# Patient Record
Sex: Male | Born: 1985 | Hispanic: No | Marital: Married | Smoking: Former smoker
Health system: Southern US, Community
[De-identification: ages and names within clinical notes are randomized; demographics above are authoritative.]

## PROBLEM LIST (undated history)

## (undated) DIAGNOSIS — R569 Unspecified convulsions: Secondary | ICD-10-CM

## (undated) HISTORY — PX: FOOT SURGERY: SHX648

---

## 2016-11-27 ENCOUNTER — Inpatient Hospital Stay (HOSPITAL_BASED_OUTPATIENT_CLINIC_OR_DEPARTMENT_OTHER)
Admission: EM | Admit: 2016-11-27 | Discharge: 2016-11-30 | DRG: 093 | Disposition: A | Discharge status: Home / Self Care | Payer: Self-pay | Attending: Family Medicine | Admitting: Family Medicine

## 2016-11-27 ENCOUNTER — Emergency Department (HOSPITAL_BASED_OUTPATIENT_CLINIC_OR_DEPARTMENT_OTHER): Payer: Self-pay

## 2016-11-27 ENCOUNTER — Encounter (HOSPITAL_BASED_OUTPATIENT_CLINIC_OR_DEPARTMENT_OTHER): Payer: Self-pay | Admitting: *Deleted

## 2016-11-27 DIAGNOSIS — R110 Nausea: Secondary | ICD-10-CM | POA: Diagnosis present

## 2016-11-27 DIAGNOSIS — R030 Elevated blood-pressure reading, without diagnosis of hypertension: Secondary | ICD-10-CM | POA: Diagnosis present

## 2016-11-27 DIAGNOSIS — Z833 Family history of diabetes mellitus: Secondary | ICD-10-CM

## 2016-11-27 DIAGNOSIS — H5509 Other forms of nystagmus: Secondary | ICD-10-CM | POA: Diagnosis present

## 2016-11-27 DIAGNOSIS — R51 Headache: Secondary | ICD-10-CM | POA: Diagnosis present

## 2016-11-27 DIAGNOSIS — T420X5A Adverse effect of hydantoin derivatives, initial encounter: Secondary | ICD-10-CM | POA: Diagnosis present

## 2016-11-27 DIAGNOSIS — H532 Diplopia: Secondary | ICD-10-CM | POA: Diagnosis present

## 2016-11-27 DIAGNOSIS — R42 Dizziness and giddiness: Secondary | ICD-10-CM | POA: Diagnosis present

## 2016-11-27 DIAGNOSIS — R739 Hyperglycemia, unspecified: Secondary | ICD-10-CM | POA: Diagnosis present

## 2016-11-27 DIAGNOSIS — T420X1A Poisoning by hydantoin derivatives, accidental (unintentional), initial encounter: Secondary | ICD-10-CM | POA: Diagnosis present

## 2016-11-27 DIAGNOSIS — R260 Ataxic gait: Principal | ICD-10-CM | POA: Diagnosis present

## 2016-11-27 DIAGNOSIS — Z87891 Personal history of nicotine dependence: Secondary | ICD-10-CM

## 2016-11-27 DIAGNOSIS — G40909 Epilepsy, unspecified, not intractable, without status epilepticus: Secondary | ICD-10-CM

## 2016-11-27 HISTORY — DX: Unspecified convulsions: R56.9

## 2016-11-27 LAB — URINALYSIS, ROUTINE W REFLEX MICROSCOPIC
Bilirubin Urine: NEGATIVE
Glucose, UA: NEGATIVE mg/dL
Hgb urine dipstick: NEGATIVE
KETONES UR: NEGATIVE mg/dL
LEUKOCYTES UA: NEGATIVE
NITRITE: NEGATIVE
PROTEIN: NEGATIVE mg/dL
Specific Gravity, Urine: 1.021 (ref 1.005–1.030)
pH: 7.5 (ref 5.0–8.0)

## 2016-11-27 LAB — CBC WITH DIFFERENTIAL/PLATELET
BASOS ABS: 0 10*3/uL (ref 0.0–0.1)
BASOS PCT: 0 %
EOS ABS: 0 10*3/uL (ref 0.0–0.7)
EOS PCT: 0 %
HCT: 40.7 % (ref 39.0–52.0)
Hemoglobin: 14.1 g/dL (ref 13.0–17.0)
Lymphocytes Relative: 16 %
Lymphs Abs: 1.2 10*3/uL (ref 0.7–4.0)
MCH: 31.5 pg (ref 26.0–34.0)
MCHC: 34.6 g/dL (ref 30.0–36.0)
MCV: 90.8 fL (ref 78.0–100.0)
MONO ABS: 0.6 10*3/uL (ref 0.1–1.0)
Monocytes Relative: 8 %
NEUTROS ABS: 5.9 10*3/uL (ref 1.7–7.7)
Neutrophils Relative %: 76 %
PLATELETS: 231 10*3/uL (ref 150–400)
RBC: 4.48 MIL/uL (ref 4.22–5.81)
RDW: 12.9 % (ref 11.5–15.5)
WBC: 7.8 10*3/uL (ref 4.0–10.5)

## 2016-11-27 LAB — BASIC METABOLIC PANEL
ANION GAP: 8 (ref 5–15)
BUN: 13 mg/dL (ref 6–20)
CALCIUM: 9 mg/dL (ref 8.9–10.3)
CO2: 26 mmol/L (ref 22–32)
CREATININE: 0.75 mg/dL (ref 0.61–1.24)
Chloride: 102 mmol/L (ref 101–111)
GFR calc Af Amer: >60 mL/min (ref >60)
GLUCOSE: 108 mg/dL — AB (ref 65–99)
Potassium: 3.9 mmol/L (ref 3.5–5.1)
Sodium: 136 mmol/L (ref 135–145)

## 2016-11-27 LAB — GLUCOSE, CAPILLARY: Glucose-Capillary: 82 mg/dL (ref 65–99)

## 2016-11-27 LAB — PHENYTOIN LEVEL, TOTAL: Phenytoin Lvl: 55.1 ug/mL (ref 10.0–20.0)

## 2016-11-27 MED ORDER — HYDRALAZINE HCL 20 MG/ML IJ SOLN
10.0000 mg | Freq: Four times a day (QID) | INTRAMUSCULAR | Status: DC | PRN
  Filled 2016-11-27: qty 1

## 2016-11-27 MED ORDER — SODIUM CHLORIDE 0.9 % IV SOLN
INTRAVENOUS | Status: AC
  Administered 2016-11-27: via INTRAVENOUS

## 2016-11-27 MED ORDER — ACETAMINOPHEN 650 MG RE SUPP: 650.0000 mg | Freq: Four times a day (QID) | RECTAL | Status: DC | PRN

## 2016-11-27 MED ORDER — ENOXAPARIN SODIUM 40 MG/0.4ML ~~LOC~~ SOLN
40.0000 mg | SUBCUTANEOUS | Status: DC
  Administered 2016-11-28 – 2016-11-30 (×3): 40 mg via SUBCUTANEOUS
  Filled 2016-11-27 (×3): qty 0.4

## 2016-11-27 MED ORDER — LEVETIRACETAM 500 MG PO TABS
1000.0000 mg | ORAL_TABLET | Freq: Two times a day (BID) | ORAL | Status: DC
  Administered 2016-11-27 – 2016-11-30 (×6): 1000 mg via ORAL
  Filled 2016-11-27 (×6): qty 2

## 2016-11-27 MED ORDER — MECLIZINE HCL 25 MG PO TABS
25.0000 mg | ORAL_TABLET | Freq: Once | ORAL | Status: AC
  Administered 2016-11-27: 25 mg via ORAL
  Filled 2016-11-27: qty 1

## 2016-11-27 MED ORDER — ACETAMINOPHEN 325 MG PO TABS: 650.0000 mg | ORAL_TABLET | Freq: Four times a day (QID) | ORAL | Status: DC | PRN

## 2016-11-27 MED ORDER — SODIUM CHLORIDE 0.9% FLUSH
3.0000 mL | Freq: Two times a day (BID) | INTRAVENOUS | Status: DC
  Administered 2016-11-28 – 2016-11-30 (×5): 3 mL via INTRAVENOUS

## 2016-11-27 NOTE — ED Triage Notes (Addendum)
Pt c/o dizziness and double vision x 4 days , sent here from [facility] for eval, pt denies h/a

## 2016-11-27 NOTE — H&P (Addendum)
TRH H&P   Patient Demographics:    Mario Mcbride, is a 31 y.o. male  MRN: [id number]   DOB - [birth year]  Admit Date - 11/27/2016  Outpatient Primary MD for the patient is Patient, No Pcp Per  Referring MD/NP/PA: Rolan BuccoMelanie Belfi  Outpatient Specialists:  [place] in [place]   [facility]  Chief Complaint  Patient presents with  . Dizziness      HPI:    Mario GarterRushi Ewalt  is a 31 y.o. male, w seizure disorder who apparently has been taking and extra 100mg  po qday of dilantin for the past 30 out of 60 days.  He typically take 300mg  po bid of dilantin and 1000mg  po bid of keppra.  Pt notes dizziness and blurred vision and gait instability  for the past 3 days and therefore presented to urgent care and then sent to ED for evaluation.    In ED,  Pt was noted to have dilantin level of 55.1  CT brain negative for any acute process.  No seizure activity noted.  [facility] of dilantin toxicity.     Review of systems:    In addition to the HPI above, No Fever-chills, No Headache, No changes with hearing, No problems swallowing food or Liquids, No Chest pain, Cough or Shortness of Breath, No Abdominal pain, No Nausea or Vommitting, Bowel movements are regular, No Blood in stool or Urine, No dysuria, No new skin rashes or bruises, No new joints pains-aches,  No new weakness, tingling, numbness in any extremity, No recent weight gain or loss, No polyuria, polydypsia or polyphagia, No significant Mental Stressors.  A full 10 point Review of Systems was done, except as stated above, all other Review of Systems were negative.   With Past History of the following :    Past Medical History:  Diagnosis Date  . Seizures (HCC)       Past Surgical History:  Procedure Laterality Date  . FOOT SURGERY Left        Social History:     Social History  Substance Use Topics  . Smoking status: Former Smoker    Types: Cigarettes  . Smokeless tobacco: Never Used  . Alcohol use No     Lives - at home  Mobility - walks by self typically   Family History :     Family History  Problem Relation Age of Onset  . Diabetes Mother       Home Medications:   Prior to Admission medications   Medication Sig Start Date End Date Taking? Authorizing Provider  levETIRAcetam (KEPPRA) 1000 MG tablet Take 1,000 mg by mouth 2 (two) times daily.   Yes [provider]  phenytoin (DILANTIN) 300 MG ER capsule Take 300 mg by mouth 2 (two) times daily.   Yes [provider]  Allergies:    No Known Allergies   Physical Exam:   Vitals  Blood pressure (!) 113/93, pulse 60, temperature 98.4 F (36.9 C), resp. rate 18, height 5\' 11"  (1.803 m), weight 89.4 kg (197 lb), SpO2 100 %.   1. General lying in bed in NAD,    2. Normal affect and insight, Not Suicidal or Homicidal, Awake Alert, Oriented X 3.  3. No F.N deficits, ALL C.Nerves Intact, Strength 5/5 all 4 extremities, Sensation intact all 4 extremities, Plantars down going.  4. Ears and Eyes appear Normal, Conjunctivae clear, PERRLA. Moist Oral Mucosa.  5. Supple Neck, No JVD, No cervical lymphadenopathy appriciated, No Carotid Bruits.  6. Symmetrical Chest wall movement, Good air movement bilaterally, CTAB.  7. RRR, No Gallops, Rubs or Murmurs, No Parasternal Heave.  8. Positive Bowel Sounds, Abdomen Soft, No tenderness, No organomegaly appriciated,No rebound -guarding or rigidity.  9.  No Cyanosis, Normal Skin Turgor, No Skin Rash or Bruise.  10. Good muscle tone,  joints appear normal , no effusions, Normal ROM.  11. No Palpable Lymph Nodes in Neck or Axillae  Good finger to nose, visual field intact    Data Review:    CBC  Recent Labs Lab 11/27/16 1539  WBC 7.8  HGB 14.1  HCT 40.7  PLT 231  MCV 90.8    MCH 31.5  MCHC 34.6  RDW 12.9  LYMPHSABS 1.2  MONOABS 0.6  EOSABS 0.0  BASOSABS 0.0   ------------------------------------------------------------------------------------------------------------------  Chemistries   Recent Labs Lab 11/27/16 1539  NA 136  K 3.9  CL 102  CO2 26  GLUCOSE 108*  BUN 13  CREATININE 0.75  CALCIUM 9.0   ------------------------------------------------------------------------------------------------------------------ estimated creatinine clearance is 143.8 mL/min (by C-G formula based on SCr of 0.75 mg/dL). ------------------------------------------------------------------------------------------------------------------ No results for input(s): TSH, T4TOTAL, T3FREE, THYROIDAB in the last 72 hours.  Invalid input(s): FREET3  Coagulation profile No results for input(s): INR, PROTIME in the last 168 hours. ------------------------------------------------------------------------------------------------------------------- No results for input(s): DDIMER in the last 72 hours. -------------------------------------------------------------------------------------------------------------------  Cardiac Enzymes No results for input(s): CKMB, TROPONINI, MYOGLOBIN in the last 168 hours.  Invalid input(s): CK ------------------------------------------------------------------------------------------------------------------ No results found for: BNP   ---------------------------------------------------------------------------------------------------------------  Urinalysis    Component Value Date/Time   COLORURINE YELLOW 11/27/2016 1616   APPEARANCEUR CLOUDY (A) 11/27/2016 1616   LABSPEC 1.021 11/27/2016 1616   PHURINE 7.5 11/27/2016 1616   GLUCOSEU NEGATIVE 11/27/2016 1616   HGBUR NEGATIVE 11/27/2016 1616   BILIRUBINUR NEGATIVE 11/27/2016 1616   KETONESUR NEGATIVE 11/27/2016 1616   PROTEINUR NEGATIVE 11/27/2016 1616   NITRITE NEGATIVE 11/27/2016  1616   LEUKOCYTESUR NEGATIVE 11/27/2016 1616    ----------------------------------------------------------------------------------------------------------------   Imaging Results:    Ct Head Wo Contrast  Result Date: 11/27/2016 CLINICAL DATA:  Dizziness, double vision. EXAM: CT HEAD WITHOUT CONTRAST TECHNIQUE: Contiguous axial images were obtained from the base of the skull through the vertex without intravenous contrast. COMPARISON:  None. 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 No mass effect or midline shift is noted. Ventricular size is within normal limits. There is no evidence of mass lesion, hemorrhage or acute infarction. Vascular: No hyperdense vessel or unexpected calcification. Skull: Normal. Negative for fracture or focal lesion. Sinuses/Orbits: No acute finding. Other: None. IMPRESSION: No acute intracranial abnormality seen. Electronically Signed   By: Lupita Raider, M.D.   On: 11/27/2016 16:33      Assessment & Plan:    Active Problems:   Dilantin toxicity  Seizure disorder (HCC)    Dizziness Check MRI brain STOP dilantin Check dilantin level in the am Neurology consulted by ED, appreciate input  Seizure do Cont Keppra 1000mg  po bid  Hyperglycemia Check hga1c  Elevated bp without diagnosis of hypertension Monitor bp    DVT Prophylaxis  Lovenox - SCDs     AM Labs Ordered, also please review Full Orders  Family Communication: Admission, patients condition and plan of care including tests being ordered have been discussed with the patient  who indicate understanding and agree with the plan and Code Status.  Code Status FULL CODE  Likely DC to  home  Condition GUARDED    Consults called: neurology by ED  Admission status: inpatient  Time spent in minutes : 45 minutes   Pearson Grippe M.D on 11/27/2016 at 9:41 PM  Between 7am to 7pm - Pager - [phone] After 7pm go to [email or web address] - password [facility]  [facility] - Office  [phone]

## 2016-11-27 NOTE — ED Provider Notes (Signed)
[facility] Provider Note   CSN: [id number] Arrival date & time: 11/27/16  1450   By signing my name below, I, Cynda Acres, attest that this documentation has been prepared under the direction and in the presence of Rolan Bucco, MD. Electronically Signed: Cynda Acres, Scribe. 11/27/16. 3:32 PM.   History   Chief Complaint Chief Complaint  Patient presents with  . Dizziness   HPI Comments: Mario Mcbride is a 31 y.o. male with a history of seizure disorder (diagnosed 4 years ago due to stress), who presents to the Emergency Department complaining of sudden-onset, intermittent dizziness that began 4 days ago. Patient states he suddenly developed intermittent dizziness and double vision. Patient states he is unable to ambulate without assistance due to an unsteady gait. His symptoms are worse with head rotation. Patient denies any loss of consciousness or head injury. Patient reports being seen at urgent care earlier today, he was advised to come to the emergency department for evaluation. Patient does have a history of seizure disorder that occurs while sleeping, he has been compliant with his keppra and dilantin, last episode was three months ago. Patient reports an associated headache. No medications were taken prior to arrival. Patient states his symptoms improve with lemon juice. Patient denies any fever, chills, cough, chest pain, shortness of breath, speech difficulty, rash, or any additional symptoms. No speech deficits.  He says that he sees two of everything when he looks with both eyes, if he covers one eye, he only sees one figure.  The history is provided by the patient. No language interpreter was used.    Past Medical History:  Diagnosis Date  . Seizures (HCC)     There are no active problems to display for this patient.   History reviewed. No pertinent surgical history.     Home Medications    Prior to Admission medications   Medication Sig Start  Date End Date Taking? Authorizing Provider  levETIRAcetam (KEPPRA) 1000 MG tablet Take 1,000 mg by mouth 2 (two) times daily.   Yes [provider]  phenytoin (DILANTIN) 300 MG ER capsule Take 300 mg by mouth 2 (two) times daily.   Yes [provider]    Family History No family history on file.  Social History Social History  Substance Use Topics  . Smoking status: Former Games developer  . Smokeless tobacco: Not on file  . Alcohol use No     Allergies   Patient has no known allergies.   Review of Systems Review of Systems  Constitutional: Negative for chills, diaphoresis, fatigue and fever.  HENT: Negative for congestion, rhinorrhea and sneezing.   Eyes: Positive for visual disturbance.  Respiratory: Negative for cough, chest tightness and shortness of breath.   Cardiovascular: Negative for chest pain and leg swelling.  Gastrointestinal: Negative for abdominal pain, blood in stool, diarrhea, nausea and vomiting.  Genitourinary: Negative for difficulty urinating, flank pain, frequency and hematuria.  Musculoskeletal: Positive for gait problem. Negative for arthralgias and back pain.  Skin: Negative for rash.  Neurological: Positive for dizziness and headaches. Negative for syncope, speech difficulty, weakness and numbness.  All other systems reviewed and are negative.    Physical Exam Updated Vital Signs BP (!) 120/97 (BP Location: Left Arm)   Pulse 64   Temp 99.9 F (37.7 C)   Resp 19   Ht 5\' 11"  (1.803 m)   Wt 89.4 kg (197 lb)   SpO2 99%   BMI 27.48 kg/m   Physical Exam  Constitutional: He is oriented to person, place, and time. He appears well-developed and well-nourished.  HENT:  Head: Normocephalic and atraumatic.  Eyes: Pupils are equal, round, and reactive to light.  Positive horizontal and rotational nystagmus, but no vertical nystagmus.   Neck: Normal range of motion. Neck supple.  Cardiovascular: Normal rate, regular rhythm and normal heart  sounds.   Pulmonary/Chest: Effort normal and breath sounds normal. No respiratory distress. He has no wheezes. He has no rales. He exhibits no tenderness.  Abdominal: Soft. Bowel sounds are normal. There is no tenderness. There is no rebound and no guarding.  Musculoskeletal: Normal range of motion. He exhibits no edema.  Lymphadenopathy:    He has no cervical adenopathy.  Neurological: He is alert and oriented to person, place, and time. He displays normal reflexes. No cranial nerve deficit or sensory deficit. He exhibits normal muscle tone. Coordination normal.  Motor 5/5 all extremities. Sensation grossly intact in all extremities. Cranial nerves 2-12 are grossly intact. Slightly ataxic gait.   Skin: Skin is warm and dry. No rash noted.  Psychiatric: He has a normal mood and affect.     ED Treatments / Results  DIAGNOSTIC STUDIES: Oxygen Saturation is 99% on RA, normal by my interpretation.    COORDINATION OF CARE: 3:31 PM Discussed treatment plan with pt at bedside and pt agreed to plan, which includes a head CT.   Labs (all labs ordered are listed, but only abnormal results are displayed) Labs Reviewed  BASIC METABOLIC PANEL - Abnormal; Notable for the following:       Result Value   Glucose, Bld 108 (*)    All other components within normal limits  URINALYSIS, ROUTINE W REFLEX MICROSCOPIC - Abnormal; Notable for the following:    APPearance CLOUDY (*)    All other components within normal limits  PHENYTOIN LEVEL, TOTAL - Abnormal; Notable for the following:    Phenytoin Lvl 55.1 (*)    All other components within normal limits  CBC WITH DIFFERENTIAL/PLATELET    EKG  EKG Interpretation  Date/Time:  Monday November 27 2016 15:55:42 EDT Ventricular Rate:  78 PR Interval:    QRS Duration: 84 QT Interval:  353 QTC Calculation: 402 R Axis:   62 Text Interpretation:  Sinus rhythm ST elev, probable normal early repol pattern No old tracing to compare Confirmed by Rolan BuccoBelfi,  Randye Treichler [phone]([id number]) on 11/27/2016 4:05:32 PM       Radiology Ct Head Wo Contrast  Result Date: 11/27/2016 CLINICAL DATA:  Dizziness, double vision. EXAM: CT HEAD WITHOUT CONTRAST TECHNIQUE: Contiguous axial images were obtained from the base of the skull through the vertex without intravenous contrast. COMPARISON:  None. 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 No mass effect or midline shift is noted. Ventricular size is within normal limits. There is no evidence of mass lesion, hemorrhage or acute infarction. Vascular: No hyperdense vessel or unexpected calcification. Skull: Normal. Negative for fracture or focal lesion. Sinuses/Orbits: No acute finding. Other: None. IMPRESSION: No acute intracranial abnormality seen. Electronically Signed   By: Lupita RaiderJames  Green Jr, M.D.   On: 11/27/2016 16:33    Procedures Procedures (including critical care time)  Medications Ordered in ED Medications  meclizine (ANTIVERT) tablet 25 mg (25 mg Oral Given 11/27/16 1539)     Initial Impression / Assessment and Plan / ED Course  I have reviewed the triage vital signs and the nursing notes.  Pertinent labs & imaging results that were available during my  care of the patient were reviewed by me and considered in my medical decision making (see chart for details).     Patient presents with ataxia, double vision and dizziness. He was given meclizine without improvement in symptoms. Head CT is negative. His Dilantin level is markedly elevated and this is likely the etiology of his symptoms. Given his ataxia, he did feel that he needs to be admitted. I spoke with Dr.Arora with neurology and it was decided that we will admit him to [facility] from here. Dr. Wilford Corner states that his Dilantin level needs to be checked daily, his daily needs to be held and he can be maintained on Keppra at this point until his Dilantin level normalizes. He feels that neurology would need to be consulted if he has a  breakthrough seizure or other concerns.  I spoke with Dr. Laural Benes with the hospitalist service who has accepted the patient for admission to [facility].  Final Clinical Impressions(s) / ED Diagnoses   Final diagnoses:  Dilantin toxicity, accidental or unintentional, initial encounter    New Prescriptions New Prescriptions   No medications on file   I personally performed the services described in this documentation, which was scribed in my presence.  The recorded information has been reviewed and considered.    Rolan Bucco, MD 11/27/16 1750

## 2016-11-27 NOTE — ED Notes (Addendum)
Ambulated in the hallway-patient constantly stumbling, unsteady gait.  MD made aware.  Patient placed back in bed on cardiac monitor.

## 2016-11-27 NOTE — Consult Note (Signed)
Admission H&P    Chief Complaint: Dilantin toxicity.  HPI: Mario Mcbride is an 31 y.o. male history of seizure disorder treated with Dilantin and Keppra. His prescribed dose of Dilantin reportedly was 300 mg twice a day. Patient admits to taking extra Dilantin if he feels stressed stroke possibly a seizure aura. Keppra dose is 1000 mg twice a day. He has not had his Dilantin level checked for at least several years. He admits to having episodes of being unsteady with walking in addition to being dizzy. For the past several days however he has experienced diplopia, in addition to worsening of gait instability and dizziness. Dilantin level today was 55.1. CT scan of his head was obtained which showed no acute abnormality. He was admitted for management of Dilantin toxicity and appropriate adjustment of anticonvulsant medications. He reportedly has only had nocturnal seizures.  Past Medical History:  Diagnosis Date  . Seizures ([address])     Past Surgical History:  Procedure Laterality Date  . FOOT SURGERY Left     Family History  Problem Relation Age of Onset  . Diabetes Mother    Social History:  reports that he has quit smoking. His smoking use included Cigarettes. He has never used smokeless tobacco. He reports that he does not drink alcohol or use drugs.  Allergies: No Known Allergies  Medications Prior to Admission  Medication Sig Dispense Refill  . levETIRAcetam (KEPPRA) 1000 MG tablet Take 1,000 mg by mouth 2 (two) times daily.    . phenytoin (DILANTIN) 100 MG ER capsule Take 300-400 mg by mouth See admin instructions. 300 mg in the morning and 300-400 mg at bedtime      ROS: History obtained from the patient  General ROS: negative for - chills, fatigue, fever, night sweats, weight gain or weight loss Psychological ROS: negative for - behavioral disorder, hallucinations, memory difficulties, mood swings or suicidal ideation Ophthalmic ROS: negative for - blurry vision, double vision,  eye pain or loss of vision ENT ROS: negative for - epistaxis, nasal discharge, oral lesions, sore throat, tinnitus or vertigo Allergy and Immunology ROS: negative for - hives or itchy/watery eyes Hematological and Lymphatic ROS: negative for - bleeding problems, bruising or swollen lymph nodes Endocrine ROS: negative for - galactorrhea, hair pattern changes, polydipsia/polyuria or temperature intolerance Respiratory ROS: negative for - cough, hemoptysis, shortness of breath or wheezing Cardiovascular ROS: negative for - chest pain, dyspnea on exertion, edema or irregular heartbeat Gastrointestinal ROS: negative for - abdominal pain, diarrhea, hematemesis, nausea/vomiting or stool incontinence Genito-Urinary ROS: negative for - dysuria, hematuria, incontinence or urinary frequency/urgency Musculoskeletal ROS: negative for - joint swelling or muscular weakness Neurological ROS: as noted in HPI Dermatological ROS: negative for rash and skin lesion changes  Physical Examination: Blood pressure (!) 113/93, pulse 60, temperature 98.4 F (36.9 C), resp. rate 18, height _0  (1.803 m), weight 89.4 kg (197 lb), SpO2 100 %.  HEENT-  Normocephalic, no lesions, without obvious abnormality.  Normal external eye and conjunctiva.  Normal TM's bilaterally.  Normal auditory canals and external ears. Normal external nose, mucus membranes and septum.  Normal pharynx. Neck supple with no masses, nodes, nodules or enlargement. Cardiovascular - regular rate and rhythm, S1, S2 normal, no murmur, click, rub or gallop Lungs - chest clear, no wheezing, rales, normal symmetric air entry Abdomen - soft, non-tender; bowel sounds normal; no masses,  no organomegaly Extremities - no edema  Neurologic Examination: Mental Status: Alert, oriented, thought content appropriate.  Speech fluent without  evidence of aphasia. Able to follow commands without difficulty. Cranial Nerves: II-Visual fields were  normal. III/IV/VI-Pupils were equal and reacted normally to light. Extraocular movements were full and conjugate. Horizontal and rotatory nystagmus was noted on right left lateral gaze. He had no vertical nystagmus.    V/VII-no facial numbness and no facial weakness. VIII-normal. X-normal speech and symmetrical palatal movement. XI: trapezius strength/neck flexion strength normal bilaterally XII-midline tongue extension with normal strength. Motor: 5/5 bilaterally with normal tone and bulk Sensory: Normal throughout. Deep Tendon Reflexes: 2+ and symmetric. Plantars: Flexor bilaterally Cerebellar: Normal finger-to-nose testing. Carotid auscultation: Normal  Results for orders placed or performed during the hospital encounter of 11/27/16 (from the past 48 hour(s))  Basic metabolic panel     Status: Abnormal   Collection Time: 11/27/16  3:39 PM  Result Value Ref Range   Sodium 136 135 - 145 mmol/L   Potassium 3.9 3.5 - 5.1 mmol/L   Chloride 102 101 - 111 mmol/L   CO2 26 22 - 32 mmol/L   Glucose, Bld 108 (H) 65 - 99 mg/dL   BUN 13 6 - 20 mg/dL   Creatinine, Ser 0.75 0.61 - 1.24 mg/dL   Calcium 9.0 8.9 - 10.3 mg/dL   GFR calc non Af Amer >60 >60 mL/min   GFR calc Af Amer >60 >60 mL/min    Comment: (NOTE) The eGFR has been calculated using the CKD EPI equation. This calculation has not been validated in all clinical situations. eGFR's persistently <60 mL/min signify possible Chronic Kidney Disease.    Anion gap 8 5 - 15  CBC with Differential     Status: None   Collection Time: 11/27/16  3:39 PM  Result Value Ref Range   WBC 7.8 4.0 - 10.5 K/uL   RBC 4.48 4.22 - 5.81 MIL/uL   Hemoglobin 14.1 13.0 - 17.0 g/dL   HCT 40.7 39.0 - 52.0 %   MCV 90.8 78.0 - 100.0 fL   MCH 31.5 26.0 - 34.0 pg   MCHC 34.6 30.0 - 36.0 g/dL   RDW 12.9 11.5 - 15.5 %   Platelets 231 150 - 400 K/uL   Neutrophils Relative % 76 %   Neutro Abs 5.9 1.7 - 7.7 K/uL   Lymphocytes Relative 16 %   Lymphs Abs 1.2  0.7 - 4.0 K/uL   Monocytes Relative 8 %   Monocytes Absolute 0.6 0.1 - 1.0 K/uL   Eosinophils Relative 0 %   Eosinophils Absolute 0.0 0.0 - 0.7 K/uL   Basophils Relative 0 %   Basophils Absolute 0.0 0.0 - 0.1 K/uL  Phenytoin level, total     Status: Abnormal   Collection Time: 11/27/16  3:39 PM  Result Value Ref Range   Phenytoin Lvl 55.1 (HH) 10.0 - 20.0 ug/mL    Comment: CRITICAL RESULT CALLED TO, READ BACK BY AND VERIFIED WITH: Kriste Basque RN AT 1647 ON 11/27/16 BY I.SUGUT RESULTS CONFIRMED BY MANUAL DILUTION   Urinalysis, Routine w reflex microscopic     Status: Abnormal   Collection Time: 11/27/16  4:16 PM  Result Value Ref Range   Color, Urine YELLOW YELLOW   APPearance CLOUDY (A) CLEAR   Specific Gravity, Urine 1.021 1.005 - 1.030   pH 7.5 5.0 - 8.0   Glucose, UA NEGATIVE NEGATIVE mg/dL   Hgb urine dipstick NEGATIVE NEGATIVE   Bilirubin Urine NEGATIVE NEGATIVE   Ketones, ur NEGATIVE NEGATIVE mg/dL   Protein, ur NEGATIVE NEGATIVE mg/dL   Nitrite NEGATIVE NEGATIVE  Leukocytes, UA NEGATIVE NEGATIVE    Comment: Microscopic not done on urines with negative protein, blood, leukocytes, nitrite, or glucose < 500 mg/dL.  Glucose, capillary     Status: None   Collection Time: 11/27/16  9:50 PM  Result Value Ref Range   Glucose-Capillary 82 65 - 99 mg/dL   Comment 1 Notify RN    Comment 2 Document in Chart    Ct Head Wo Contrast  Result Date: 11/27/2016 CLINICAL DATA:  Dizziness, double vision. EXAM: CT HEAD WITHOUT CONTRAST TECHNIQUE: Contiguous axial images were obtained from the base of the skull through the vertex without intravenous contrast. COMPARISON:  None. 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 No mass effect or midline shift is noted. Ventricular size is within normal limits. There is no evidence of mass lesion, hemorrhage or acute infarction. Vascular: No hyperdense vessel or unexpected calcification. Skull: Normal. Negative for  fracture or focal lesion. Sinuses/Orbits: No acute finding. Other: None. IMPRESSION: No acute intracranial abnormality seen. Electronically Signed   By: Marijo Conception, M.D.   On: 11/27/2016 16:33    Assessment/Plan 31 year old man with a history of seizure disorder presenting with acute Dilantin toxicity.  Recommendations: 1. Hold Dilantin until level is back within normal range. 2. No change in current dose of Keppra for now. 3. Pharmacy consult for Dilantin management, including dosing once his level returns to normal.  C.R. Nicole Kindred, MD [facility] [phone]  11/27/2016, 11:05 PM

## 2016-11-27 NOTE — ED Notes (Signed)
   Test: Dilantin  Critical Value: 55.11  Name of Provider Notified: Dr. Fredderick PhenixBelfi  Orders Received? Or Actions Taken?: No new orders at the present time.

## 2016-11-28 LAB — COMPREHENSIVE METABOLIC PANEL
ALBUMIN: 3.6 g/dL (ref 3.5–5.0)
ALT: 20 U/L (ref 17–63)
AST: 17 U/L (ref 15–41)
Alkaline Phosphatase: 111 U/L (ref 38–126)
Anion gap: 7 (ref 5–15)
BUN: 12 mg/dL (ref 6–20)
CHLORIDE: 105 mmol/L (ref 101–111)
CO2: 26 mmol/L (ref 22–32)
Calcium: 8.9 mg/dL (ref 8.9–10.3)
Creatinine, Ser: 0.74 mg/dL (ref 0.61–1.24)
GFR calc Af Amer: >60 mL/min (ref >60)
Glucose, Bld: 100 mg/dL — ABNORMAL HIGH (ref 65–99)
POTASSIUM: 3.8 mmol/L (ref 3.5–5.1)
SODIUM: 138 mmol/L (ref 135–145)
Total Bilirubin: 0.5 mg/dL (ref 0.3–1.2)
Total Protein: 6.7 g/dL (ref 6.5–8.1)

## 2016-11-28 LAB — CBC
HEMATOCRIT: 41.3 % (ref 39.0–52.0)
Hemoglobin: 13.7 g/dL (ref 13.0–17.0)
MCH: 30.8 pg (ref 26.0–34.0)
MCHC: 33.2 g/dL (ref 30.0–36.0)
MCV: 92.8 fL (ref 78.0–100.0)
Platelets: 217 10*3/uL (ref 150–400)
RBC: 4.45 MIL/uL (ref 4.22–5.81)
RDW: 13 % (ref 11.5–15.5)
WBC: 8.9 10*3/uL (ref 4.0–10.5)

## 2016-11-28 LAB — PHENYTOIN LEVEL, TOTAL: Phenytoin Lvl: 48.8 ug/mL (ref 10.0–20.0)

## 2016-11-28 LAB — GLUCOSE, CAPILLARY: GLUCOSE-CAPILLARY: 70 mg/dL (ref 65–99)

## 2016-11-28 LAB — HIV ANTIBODY (ROUTINE TESTING W REFLEX): HIV Screen 4th Generation wRfx: NONREACTIVE

## 2016-11-28 NOTE — Progress Notes (Addendum)
CRITICAL VALUE ALERT  Critical Value:  Dilantin level 48.8  Date & Time Notied:  11/27/16  5:05am  Provider Notified: Schorr, NP  Orders Received/Actions taken: No new orders. Will continue to monitor.

## 2016-11-28 NOTE — Progress Notes (Signed)
Patient ID: Mario Mcbride, male   DOB: [birth year], 31 y.o.   MRN: [id number]  PROGRESS NOTE    Mario Mcbride  [id number] DOB: [birth year] DOA: 11/27/2016 PCP: No primary care provider on file.   Brief Narrative:  31 year old male with history of seizure disorder who apparently was taking extra Dilantin recently presented with dizziness, blurred vision and gait instability. He was found to have Dilantin toxicity and was hence admitted. Neurohospitalist has evaluated the patient   Assessment & Plan:   Active Problems:   Dilantin toxicity, accidental or unintentional, initial encounter   Seizure disorder (HCC)   Hyperglycemia   Elevated BP without diagnosis of hypertension   Dilantin toxicity - Presenting with dizziness, gait ataxia and blurred vision. Symptoms are improving. - Dilantin level 48.8 today. Repeat levels in the morning.  Seizure disorder - Continue Keppra. Hold Dilantin. Repeat a.m. Dilantin level. Neuro hospitalist consultation appreciated.   DVT prophylaxis: Lovenox Code Status:  Full Family Communication: Spoke to wife present at bedside Disposition Plan: Home in next few days  Consultants: Neurology   Procedures: None  Antimicrobials: None  Subjective: Patient seen and examined at bedside. Patient feels much better. He denies current dizziness or double vision. No overnight fever or vomiting.  Objective: Vitals:   11/27/16 1930 11/27/16 2103 11/28/16 0710 11/28/16 1357  BP: (!) 125/99 (!) 113/93 121/70 (!) 128/91  Pulse: 63 60 73 63  Resp: 14 18  18   Temp:  98.4 F (36.9 C) 98.3 F (36.8 C) 98.4 F (36.9 C)  TempSrc:    Oral  SpO2: 100% 100% 100% 100%  Weight:      Height:        Intake/Output Summary (Last 24 hours) at 11/28/16 1424 Last data filed at 11/28/16 1000  Gross per 24 hour  Intake            577.5 ml  Output              350 ml  Net            227.5 ml   Filed Weights   11/27/16 1457  Weight: 89.4 kg (197 lb)     Examination:  General exam: Appears calm and comfortable  Respiratory system: Bilateral decreased breath sound at bases Cardiovascular system: S1 & S2 heard, Rate controlled  Gastrointestinal system: Abdomen is nondistended, soft and nontender. Normal bowel sounds heard. Central nervous system: Alert and oriented. No focal neurological deficits. Moving extremities    Data Reviewed: I have personally reviewed following labs and imaging studies  CBC:  Recent Labs Lab 11/27/16 1539 11/28/16 0424  WBC 7.8 8.9  NEUTROABS 5.9  --   HGB 14.1 13.7  HCT 40.7 41.3  MCV 90.8 92.8  PLT 231 217   Basic Metabolic Panel:  Recent Labs Lab 11/27/16 1539 11/28/16 0424  NA 136 138  K 3.9 3.8  CL 102 105  CO2 26 26  GLUCOSE 108* 100*  BUN 13 12  CREATININE 0.75 0.74  CALCIUM 9.0 8.9   GFR: Estimated Creatinine Clearance: 143.8 mL/min (by C-G formula based on SCr of 0.74 mg/dL). Liver Function Tests:  Recent Labs Lab 11/28/16 0424  AST 17  ALT 20  ALKPHOS 111  BILITOT 0.5  PROT 6.7  ALBUMIN 3.6   No results for input(s): LIPASE, AMYLASE in the last 168 hours. No results for input(s): AMMONIA in the last 168 hours. Coagulation Profile: No results for input(s): INR, PROTIME in the last 168 hours. Cardiac Enzymes:  No results for input(s): CKTOTAL, CKMB, CKMBINDEX, TROPONINI in the last 168 hours. BNP (last 3 results) No results for input(s): PROBNP in the last 8760 hours. HbA1C: No results for input(s): HGBA1C in the last 72 hours. CBG:  Recent Labs Lab 11/27/16 2150 11/28/16 0739  GLUCAP 82 70   Lipid Profile: No results for input(s): CHOL, HDL, LDLCALC, TRIG, CHOLHDL, LDLDIRECT in the last 72 hours. Thyroid Function Tests: No results for input(s): TSH, T4TOTAL, FREET4, T3FREE, THYROIDAB in the last 72 hours. Anemia Panel: No results for input(s): VITAMINB12, FOLATE, FERRITIN, TIBC, IRON, RETICCTPCT in the last 72 hours. Sepsis Labs: No results for  input(s): PROCALCITON, LATICACIDVEN in the last 168 hours.  No results found for this or any previous visit (from the past 240 hour(s)).       Radiology Studies: Ct Head Wo Contrast  Result Date: 11/27/2016 CLINICAL DATA:  Dizziness, double vision. EXAM: CT HEAD WITHOUT CONTRAST TECHNIQUE: Contiguous axial images were obtained from the base of the skull through the vertex without intravenous contrast. COMPARISON:  None. 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 No mass effect or midline shift is noted. Ventricular size is within normal limits. There is no evidence of mass lesion, hemorrhage or acute infarction. Vascular: No hyperdense vessel or unexpected calcification. Skull: Normal. Negative for fracture or focal lesion. Sinuses/Orbits: No acute finding. Other: None. IMPRESSION: No acute intracranial abnormality seen. Electronically Signed   By: Lupita Raider, M.D.   On: 11/27/2016 16:33        Scheduled Meds: . enoxaparin (LOVENOX) injection  40 mg Subcutaneous Q24H  . levETIRAcetam  1,000 mg Oral BID  . sodium chloride flush  3 mL Intravenous Q12H   Continuous Infusions:   LOS: 1 day        Glade Lloyd, MD [facility] Pager [phone]  If 7PM-7AM, please contact night-coverage [email or web address] Password [email or web address] 11/28/2016, 2:24 PM

## 2016-11-28 NOTE — Progress Notes (Signed)
MEDICATION RELATED CONSULT NOTE - INITIAL   Pharmacy Consult for Phenytoin Indication: hx of seizures  No Known Allergies  Patient Measurements: Height: 5\' 11"  (180.3 cm) Weight: 197 lb (89.4 kg) IBW/kg (Calculated) : 75.3  Vital Signs: Temp: 98.4 F (36.9 C) (07/10 1357) Temp Source: Oral (07/10 1357) BP: 128/91 (07/10 1357) Pulse Rate: 63 (07/10 1357) Intake/Output from previous day: 07/09 0701 - 07/10 0700 In: 457.5 [I.V.:457.5] Out: 350 [Urine:350] Intake/Output from this shift: Total I/O In: 120 [P.O.:120] Out: 450 [Urine:450]  Labs:  Recent Labs  11/27/16 1539 11/28/16 0424  WBC 7.8 8.9  HGB 14.1 13.7  HCT 40.7 41.3  PLT 231 217  CREATININE 0.75 0.74  ALBUMIN  --  3.6  PROT  --  6.7  AST  --  17  ALT  --  20  ALKPHOS  --  111  BILITOT  --  0.5   Estimated Creatinine Clearance: 143.8 mL/min (by C-G formula based on SCr of 0.74 mg/dL).   Microbiology: No results found for this or any previous visit (from the past 720 hour(s)).  Medical History: Past Medical History:  Diagnosis Date  . Seizures (HCC)     Medications:  Prescriptions Prior to Admission  Medication Sig Dispense Refill Last Dose  . levETIRAcetam (KEPPRA) 1000 MG tablet Take 1,000 mg by mouth 2 (two) times daily.   11/27/2016 at 1000  . phenytoin (DILANTIN) 100 MG ER capsule Take 300-400 mg by mouth See admin instructions. 300 mg in the morning and 300-400 mg at bedtime   11/27/2016 at 1000    Assessment: 31 yo M with hx seizure disorder  (on Keppra and DPH PTA) presented with dizziness, blurred vision, and gait instability.  Found to have DPH toxicity.   Prescribed Dilantin dose = 300mg  BID.  Patient reports taking extra doses of Dilantin if he feels stressed or possible seizure aura.  Dilantin level is significantly elevated.  I would not be surprised if it does not return to goal until 7/12 or 7/13.  Goal of Therapy:  Dilantin level goal 10-20 mcg/ml  Plan:  Continue to hold  Dilantin. Monitor for resolve of symptoms. Dilantin level ordered for 7/11 AM by MD.  Will follow-up.  Toys 'R' UsKimberly Avalyn Molino, Pharm.D., BCPS Clinical Pharmacist Pager: [phone] Clinical phone for 11/28/2016 from 8:30-4:00 is [phone]. After 4pm, please call Main [facility] [phone]) for assistance. 11/28/2016 3:30 PM

## 2016-11-28 NOTE — Progress Notes (Signed)
Pt arrived room. Pt AxOx4. No orders in. [facility] paged to get an admitting MD. Will continue to monitor.

## 2016-11-29 DIAGNOSIS — T420X1A Poisoning by hydantoin derivatives, accidental (unintentional), initial encounter: Secondary | ICD-10-CM

## 2016-11-29 DIAGNOSIS — R739 Hyperglycemia, unspecified: Secondary | ICD-10-CM

## 2016-11-29 DIAGNOSIS — G40909 Epilepsy, unspecified, not intractable, without status epilepticus: Secondary | ICD-10-CM

## 2016-11-29 DIAGNOSIS — R030 Elevated blood-pressure reading, without diagnosis of hypertension: Secondary | ICD-10-CM

## 2016-11-29 LAB — BASIC METABOLIC PANEL
Anion gap: 7 (ref 5–15)
BUN: 12 mg/dL (ref 6–20)
CALCIUM: 8.9 mg/dL (ref 8.9–10.3)
CHLORIDE: 105 mmol/L (ref 101–111)
CO2: 25 mmol/L (ref 22–32)
CREATININE: 0.69 mg/dL (ref 0.61–1.24)
Glucose, Bld: 87 mg/dL (ref 65–99)
Potassium: 4.6 mmol/L (ref 3.5–5.1)
SODIUM: 137 mmol/L (ref 135–145)

## 2016-11-29 LAB — HEMOGLOBIN A1C
HEMOGLOBIN A1C: 5 % (ref 4.8–5.6)
MEAN PLASMA GLUCOSE: 97 mg/dL

## 2016-11-29 LAB — PHENYTOIN LEVEL, TOTAL: PHENYTOIN LVL: 39.3 ug/mL — AB (ref 10.0–20.0)

## 2016-11-29 LAB — MAGNESIUM: MAGNESIUM: 1.8 mg/dL (ref 1.7–2.4)

## 2016-11-29 NOTE — Progress Notes (Signed)
CRITICAL VALUE ALERT  Critical Value:  Dilantin 39.3  Date & Time Notied:  11/29/16 [id number]  Provider Notified: Text paged Dr. Jarvis NewcomerGrunz  Orders Received/Actions taken: Order to redraw lab.

## 2016-11-29 NOTE — Progress Notes (Signed)
Patient ID: Tresa GarterRushi Bergeman, male   DOB: 1985/12/20, 31 y.o.   MRN: [id number]  PROGRESS NOTE PCP: None  Brief Narrative:  31 year old male with history of seizure disorder who apparently was taking extra Dilantin recently presented with dizziness, blurred vision and gait instability. He was found to have Dilantin toxicity and was hence admitted. Neurohospitalist has evaluated the patient, advising observation while awaiting level to return to within normal range.  Assessment & Plan: Dilantin toxicity: Due to taking extra doses at the recommendation of prior neurologist. Presenting with dizziness, gait ataxia and blurred vision. Symptoms are improving, still not resolved. Level declining slowly 55.1 > 48.8 > 39.3. Goal 10-20.  - Recheck dilantin level daily, restart when within normal range (< 20) - [place] with [place]  Seizure disorder: Chronic, stable.  - Continue Keppra. Hold Dilantin.  DVT prophylaxis: Lovenox Code Status:  Full Family Communication: None at bedside Disposition Plan: Home in next 24-72 hrs.  Consultants: Neurology  Subjective: Dizziness worse when looking at patterend objects, described as rotational vertigo with mild nausea, no emesis. Overall improving, feels more steady on his feet. Getting antsy, wanting to go home.   Objective: BP 116/75 (BP Location: Right Arm)   Pulse 76   Temp 99 F (37.2 C)   Resp 16   Ht 5\' 11"  (1.803 m)   Wt 89.4 kg (197 lb)   SpO2 100%   BMI 27.48 kg/m   Gen: Well-appearing young male in no distress Pulm: Clear and nonlabored on room air  CV: RRR, no murmur, no JVD, no edema GI: Soft, NT, ND, +BS  Neuro: Alert and oriented. Mild end-point nystagmus bilaterally, interruptions in lateral gaze pursuit. No focal weakness. Gait not assessed.  Ext: Warm, no deformities Skin: No rashes, lesions no ulcers   Hazeline Junkeryan Kalev Temme, MD [facility] Pager [phone]  If 7PM-7AM, please contact  night-coverage [email or web address] Password [email or web address] 11/29/2016, 6:30 PM

## 2016-11-29 NOTE — Progress Notes (Signed)
MEDICATION RELATED CONSULT NOTE  Pharmacy Consult for Phenytoin Indication: hx of seizures  No Known Allergies  Patient Measurements: Height: 5\' 11"  (180.3 cm) Weight: 197 lb (89.4 kg) IBW/kg (Calculated) : 75.3  Vital Signs: Temp: 97.5 F (36.4 C) (07/11 0606) Temp Source: Oral (07/11 0606) BP: 106/68 (07/11 0606) Pulse Rate: 68 (07/11 0606) Intake/Output from previous day: 07/10 0701 - 07/11 0700 In: 360 [P.O.:360] Out: 850 [Urine:850] Intake/Output from this shift: Total I/O In: -  Out: 400 [Urine:400]  Labs:  Recent Labs  11/27/16 1539 11/28/16 0424 11/29/16 0742  WBC 7.8 8.9  --   HGB 14.1 13.7  --   HCT 40.7 41.3  --   PLT 231 217  --   CREATININE 0.75 0.74 0.69  MG  --   --  1.8  ALBUMIN  --  3.6  --   PROT  --  6.7  --   AST  --  17  --   ALT  --  20  --   ALKPHOS  --  111  --   BILITOT  --  0.5  --    Estimated Creatinine Clearance: 143.8 mL/min (by C-G formula based on SCr of 0.69 mg/dL).   Microbiology: No results found for this or any previous visit (from the past 720 hour(s)).   Medications:  Prescriptions Prior to Admission  Medication Sig Dispense Refill Last Dose  . levETIRAcetam (KEPPRA) 1000 MG tablet Take 1,000 mg by mouth 2 (two) times daily.   11/27/2016 at 1000  . phenytoin (DILANTIN) 100 MG ER capsule Take 300-400 mg by mouth See admin instructions. 300 mg in the morning and 300-400 mg at bedtime   11/27/2016 at 1000    Assessment: 31 yo M with hx seizure disorder  (on Keppra and DPH PTA) presented with dizziness, blurred vision, and gait instability.  Found to have DPH toxicity.   Prescribed Dilantin dose = 300mg  BID.  Patient reports taking extra doses of Dilantin if he feels stressed or possible seizure aura.  Dilantin level is significantly elevated but is trending down, currently 39.3.    Goal of Therapy:  Total Dilantin level goal 10-20 mcg/ml  Plan:  Continue to hold Dilantin Monitor for resolve of symptoms Repeat  Dilantin level ordered for Friday   Loura BackJennifer Balmorhea, PharmD, BCPS Clinical Pharmacist Phone for today [phone] Main pharmacy - [phone] 11/29/2016 10:32 AM

## 2016-11-30 LAB — PHENYTOIN LEVEL, TOTAL: PHENYTOIN LVL: 35.7 ug/mL — AB (ref 10.0–20.0)

## 2016-11-30 NOTE — Progress Notes (Signed)
CRITICAL VALUE ALERT  Critical Value: Phenytoin Level 35.7  Date & Time Notied:  11/30/16 at 0635  Provider Notified: Schorr,NP  Orders Received/Actions taken: No new orders at this time. Will continue to monitor and treat per MD orders.

## 2016-11-30 NOTE — Progress Notes (Signed)
MEDICATION RELATED CONSULT NOTE  Pharmacy Consult for Phenytoin Indication: hx of seizures  No Known Allergies  Patient Measurements: Height: 5\' 11"  (180.3 cm) Weight: 192 lb 3.2 oz (87.2 kg) IBW/kg (Calculated) : 75.3  Vital Signs: Temp: 97.7 F (36.5 C) (07/12 0450) BP: 124/78 (07/12 0450) Pulse Rate: 62 (07/12 0450) Intake/Output from previous day: 07/11 0701 - 07/12 0700 In: 3 [I.V.:3] Out: 1250 [Urine:1250] Intake/Output from this shift: Total I/O In: 472 [P.O.:472] Out: 200 [Urine:200]  Labs:  Recent Labs  11/27/16 1539 11/28/16 0424 11/29/16 0742  WBC 7.8 8.9  --   HGB 14.1 13.7  --   HCT 40.7 41.3  --   PLT 231 217  --   CREATININE 0.75 0.74 0.69  MG  --   --  1.8  ALBUMIN  --  3.6  --   PROT  --  6.7  --   AST  --  17  --   ALT  --  20  --   ALKPHOS  --  111  --   BILITOT  --  0.5  --    Estimated Creatinine Clearance: 143.8 mL/min (by C-G formula based on SCr of 0.69 mg/dL).   Microbiology: No results found for this or any previous visit (from the past 720 hour(s)).   Medications:  Prescriptions Prior to Admission  Medication Sig Dispense Refill Last Dose  . levETIRAcetam (KEPPRA) 1000 MG tablet Take 1,000 mg by mouth 2 (two) times daily.   11/27/2016 at 1000  . phenytoin (DILANTIN) 100 MG ER capsule Take 300-400 mg by mouth See admin instructions. 300 mg in the morning and 300-400 mg at bedtime   11/27/2016 at 1000    Assessment: 31 yo M with hx seizure disorder  (on Keppra and DPH PTA) presented with dizziness, blurred vision, and gait instability.  Found to have DPH toxicity.   Prescribed Dilantin dose = 300mg  BID.  Patient reports taking extra doses of Dilantin if he feels stressed or possible seizure aura.  Dilantin level is significantly elevated but is trending down.  .  Goal of Therapy:  Total Dilantin level goal 10-20 mcg/ml  Plan:  Continue to hold Dilantin Monitor for resolve of symptoms Currently has order for daily Phenytoin  level per MD   Alvester MorinKendra Lavar Rosenzweig, B.S., PharmD Clinical Pharmacist [facility]

## 2016-11-30 NOTE — Discharge Summary (Signed)
Physician Discharge Summary  Mario GarterRushi Torpey ZOX:[id number] DOB: 1985/07/27 DOA: 11/27/2016  PCP: None  Admit date: 11/27/2016 Discharge date: 11/30/2016  Admitted From: Home Disposition: Home   Recommendations for Outpatient Follow-up:  1. Follow up with neurology scheduled prior to discharge.  2. Recheck dilantin level and restart dilantin accordingly. Pt to continue keppra.   Home Health: None Equipment/Devices: None Discharge Condition: Stable CODE STATUS: Full Diet recommendation: Regular  Brief/Interim Summary: Mario Mcbride is a 31 year old male with history of seizure disorder who was taking extra doses of dilantin due to stress presented with dizziness, blurred vision and gait instability. He was found to have dilantin toxicity (level of 55). He was admitted for observation and monitoring of dilantin level. Neurohospitalist has evaluated the patient, advising observation while awaiting level to decrease. His symptoms improved slowly, but remains above goal range of 10-20. With symptoms resolved, neurohospitalist, Dr. Wilford CornerArora, feels the patient is stable for discharge with family supervision 24 hours daily. He will follow up to establish care with Dr. Terrace ArabiaYan, neurology, 7/16, for recheck dilantin level.   Discharge Diagnoses:  Active Problems:   Dilantin toxicity, accidental or unintentional, initial encounter   Seizure disorder (HCC)   Hyperglycemia   Elevated BP without diagnosis of hypertension  Dilantin toxicity: Due to taking extra doses at the recommendation of prior neurologist. Presenting with dizziness, gait ataxia and blurred vision. Symptoms have nearly resolved, gait is stable. He lives with family who can supervise him 24 hours a day. Level declining slowly 55.1 > 48.8 > 39.3 > 35.7. Goal 10-20.  - Recheck dilantin level 7/16, restart when within normal range (< 20) - [place] [facility] [place]: Scheduled for 7/16.  Seizure disorder: Chronic, stable.  -  Continue Keppra. Hold Dilantin.  Discharge Instructions Discharge Instructions    Discharge instructions    Complete by:  As directed    You were admitted for dilantin toxicity, the levels and symptoms of which have improved. Though the level is not back to the normal range, you are stable for discharge with the following requirements:  - Keep 24 hour supervision and assistance with walking. - Keep taking keppra, do NOT take dilantin - Follow up with neurology, Dr. Terrace ArabiaYan, on Monday for recheck of dilantin level and to establish care.  - If you experience recurrent symptoms or seizure-like activity, seek medical attention right away.  - Stay hydrated, get plenty of sleep, and relax.     Allergies as of 11/30/2016   No Known Allergies     Medication List    STOP taking these medications   DILANTIN 100 MG ER capsule Generic drug:  phenytoin     TAKE these medications   levETIRAcetam 1000 MG tablet Commonly known as:  KEPPRA Take 1,000 mg by mouth 2 (two) times daily.      Follow-up Information    Levert FeinsteinYan, Yijun, MD Follow up on 12/04/2016.   Specialty:  Neurology Why:  2:30pm. Arrive 30 minutes early. The expected payment due at this time will be $200. Contact information: [address] [place] [address] [phone]          No Known Allergies  Consultations:  Neurohospitalist, Dr. Roseanne RenoStewart, also Dr. Wilford CornerArora by phone 7/12.  Procedures/Studies: Ct Head Wo Contrast  Result Date: 11/27/2016 CLINICAL DATA:  Dizziness, double vision. EXAM: CT HEAD WITHOUT CONTRAST TECHNIQUE: Contiguous axial images were obtained from the base of the skull through the vertex without intravenous contrast. COMPARISON:  None. 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 No mass effect or midline shift is noted. Ventricular size is within normal limits. There is no evidence of mass lesion, hemorrhage or acute infarction. Vascular: No hyperdense vessel or  unexpected calcification. Skull: Normal. Negative for fracture or focal lesion. Sinuses/Orbits: No acute finding. Other: None. IMPRESSION: No acute intracranial abnormality seen. Electronically Signed   By: Lupita Raider, M.D.   On: 11/27/2016 16:33   Subjective: Pt reports significant improvement in symptoms. Using screens without dizziness, steady gait, eating well. No double vision, blurry vision, HA, nausea or vomiting. He strongly desires to go home today.  Discharge Exam: BP 124/78 (BP Location: Right Arm)   Pulse 62   Temp 97.7 F (36.5 C)   Resp 18   Ht 5\' 11"  (1.803 m)   Wt 87.2 kg (192 lb 3.2 oz)   SpO2 100%   BMI 26.81 kg/m   Gen: No distress, well-appearing Pulm: Clear and nonlabored on room air  CV: RRR, no murmur, no JVD, no edema GI: Soft, NT, ND, +BS  Neuro: Alert and oriented. No focal deficits. Nystagmus resolved. Steady gait.  Labs: Basic Metabolic Panel:  Recent Labs Lab 11/27/16 1539 11/28/16 0424 11/29/16 0742  NA 136 138 137  K 3.9 3.8 4.6  CL 102 105 105  CO2 26 26 25   GLUCOSE 108* 100* 87  BUN 13 12 12   CREATININE 0.75 0.74 0.69  CALCIUM 9.0 8.9 8.9  MG  --   --  1.8   Liver Function Tests:  Recent Labs Lab 11/28/16 0424  AST 17  ALT 20  ALKPHOS 111  BILITOT 0.5  PROT 6.7  ALBUMIN 3.6   CBC:  Recent Labs Lab 11/27/16 1539 11/28/16 0424  WBC 7.8 8.9  NEUTROABS 5.9  --   HGB 14.1 13.7  HCT 40.7 41.3  MCV 90.8 92.8  PLT 231 217   Hgb A1c  Recent Labs  11/28/16 0424  HGBA1C 5.0    Time coordinating discharge: Approximately 40 minutes  Hazeline Junker, MD  [facility] 11/30/2016, 11:47 AM Pager [phone]

## 2016-11-30 NOTE — Progress Notes (Signed)
CM received consult : Needs neurology follow up in the next week for redraw of phenytoin levels and seizure disorder. CM arranged f/u with [facility]. Appointment scheduled for 12/04/2016 @ 2:30pm with Dr.Yan. Pt made aware per CM. Gae GallopAngela Clifford Coudriet RN,BSN,CM

## 2016-11-30 NOTE — Progress Notes (Signed)
NURSING PROGRESS NOTE  Mario Mcbride [id number] Discharge Data: 11/30/2016 1:09 PM Attending Provider: Tyrone NineGrunz, Ryan B, MD PCP:No primary care provider on file.     Mario Mcbride to be D/C'd Home per MD order.  Discussed with the patient the After Visit Summary and all questions fully answered. All IV's discontinued with no bleeding noted. All belongings returned to patient for patient to take home.   Last Vital Signs:  Blood pressure 124/78, pulse 62, temperature 97.7 F (36.5 C), resp. rate 18, height 5\' 11"  (1.803 m), weight 87.2 kg (192 lb 3.2 oz), SpO2 100 %.  Discharge Medication List Allergies as of 11/30/2016   No Known Allergies     Medication List    STOP taking these medications   DILANTIN 100 MG ER capsule Generic drug:  phenytoin     TAKE these medications   levETIRAcetam 1000 MG tablet Commonly known as:  KEPPRA Take 1,000 mg by mouth 2 (two) times daily.        Roma KayserStephanie Sheikh Leverich, RN

## 2016-12-04 ENCOUNTER — Encounter: Payer: Self-pay | Admitting: Neurology

## 2016-12-04 ENCOUNTER — Ambulatory Visit (INDEPENDENT_AMBULATORY_CARE_PROVIDER_SITE_OTHER): Payer: Self-pay | Admitting: Neurology

## 2016-12-04 VITALS — BP 117/88 | HR 73 | Ht 71.0 in | Wt 187.6 lb

## 2016-12-04 DIAGNOSIS — G40909 Epilepsy, unspecified, not intractable, without status epilepticus: Secondary | ICD-10-CM

## 2016-12-04 NOTE — Progress Notes (Signed)
PATIENT: Mario Mcbride DOB: 02-Oct-1985  Chief Complaint  Patient presents with  . NP Internal Referral  . Dilatin Toxicity    Has hx of seizures. (nocturnal).  [place].  Has for the last 30days out of 60, had taken and extra dilatin 100mg  cap, due to feelings of possible seizure (was told ok to do this by neurologist in [place]).  Was seen in ED for dilatin toxicity.      HISTORICAL  Mario Mcbride is a 31 years old right-handed male accompanied by his wife, seen in refer by hospital to follow-up on his most recent hospital admission in July 2018. Initial evaluation was on December 04 2016.  I reviewed and summarized hospital admission from July 9 to12 2018  He had a history of seizures since 2014, initial seizure happened in a very stressful situation, he was putting to jail for 3 days, in front of more than 40 people, very stressful, he had train of thought about his financial stress, then had generalized seizure, with tongue biting, woke up in the physician's office, had no recollection of the events, mild post event confusion, he had 3-4 similar episodes within 3 months span, had further evaluation at local hospital, reported normal MRI of the brain, EEG, he was treated with Keppra 1000 mg twice a day, Dilantin 100 mg tablets 3 tablets twice a day since 2014,  Despite being compliant with his medications, which he gets from [place], he still has recurrent spells, about 20 spells over the past few years, is often triggered by family stress,  He also reported normal video EEG normal REM sleep patterns, this was done in 2014 at [place].  [place], bought a new hotel, is going through renovation, newly married in May 2018, he complains of excessive stress, since May, he decided to take extra dose of 100 mg Dilantin occasionally while he feel more stressed, to try to avoid a recurrent spells, he took about 30 extra tablets of Dilantin  over sixties days   By early July 2018, he noticed a revision, unsteady gait, was admitted to the hospital on November 27 2016, Dilantin level was 55.1, I have personally reviewed CT head without contrast that was normal,  His symptoms gradually improved after stopping Dilantin, he is now only taking Keppra 1000 mg twice a day, no recurrent seizure,   I reviewed laboratory evaluations, Dilantin level November 30 2016 was 35.7, 55.1 on November 27 2016, A1c was 5.0, normal CBC, CMP,    REVIEW OF SYSTEMS: Full 14 system review of systems performed and notable only for dizziness, seizure, blurry vision, double vision   ALLERGIES: No Known Allergies  HOME MEDICATIONS: Current Outpatient Prescriptions  Medication Sig Dispense Refill  . levETIRAcetam (KEPPRA) 1000 MG tablet Take 1,000 mg by mouth 2 (two) times daily.    . phenytoin (DILANTIN) 100 MG ER capsule Take 300 mg by mouth 2 (two) times daily.     No current facility-administered medications for this visit.     PAST MEDICAL HISTORY: Past Medical History:  Diagnosis Date  . Seizures (HCC)     PAST SURGICAL HISTORY: Past Surgical History:  Procedure Laterality Date  . FOOT SURGERY Left     FAMILY HISTORY: Family History  Problem Relation Age of Onset  . Diabetes Mother     SOCIAL HISTORY:  Social History   Social History  . Marital status: Married    Spouse name:  N/A  . Number of children: N/A  . Years of education: N/A   Occupational History  . Not on file.   Social History Main Topics  . Smoking status: Former Smoker    Types: Cigarettes  . Smokeless tobacco: Never Used  . Alcohol use No     Comment: yrs ago   . Drug use: No     Comment: took LSD once 7 mo ago.  Marland Kitchen Sexual activity: Not on file   Other Topics Concern  . Not on file   Social History Narrative   Lives at home with spouse.  Works on Engineer, mining.  No children.  Some College.      PHYSICAL EXAM   Vitals:   12/04/16 1513  BP: 117/88    Pulse: 73  Weight: 187 lb 9.6 oz (85.1 kg)  Height: 5\' 11"  (1.803 m)    Not recorded      Body mass index is 26.16 kg/m.  PHYSICAL EXAMNIATION:  Gen: NAD, conversant, well nourised, obese, well groomed                     Cardiovascular: Regular rate rhythm, no peripheral edema, warm, nontender. Eyes: Conjunctivae clear without exudates or hemorrhage Neck: Supple, no carotid bruits. Pulmonary: Clear to auscultation bilaterally   NEUROLOGICAL EXAM:  MENTAL STATUS: Speech:    Speech is normal; fluent and spontaneous with normal comprehension.  Cognition:     Orientation to time, place and person     Normal recent and remote memory     Normal Attention span and concentration     Normal Language, naming, repeating,spontaneous speech     Fund of knowledge   CRANIAL NERVES: CN II: Visual fields are full to confrontation. Fundoscopic exam is normal with sharp discs and no vascular changes. Pupils are round equal and briskly reactive to light. CN III, IV, VI: extraocular movement are normal. No ptosis. CN V: Facial sensation is intact to pinprick in all 3 divisions bilaterally. Corneal responses are intact.  CN VII: Face is symmetric with normal eye closure and smile. CN VIII: Hearing is normal to rubbing fingers CN IX, X: Palate elevates symmetrically. Phonation is normal. CN XI: Head turning and shoulder shrug are intact CN XII: Tongue is midline with normal movements and no atrophy.  MOTOR: There is no pronator drift of out-stretched arms. Muscle bulk and tone are normal. Muscle strength is normal.  REFLEXES: Reflexes are 2+ and symmetric at the biceps, triceps, knees, and ankles. Plantar responses are flexor.  SENSORY: Intact to light touch, pinprick, positional sensation and vibratory sensation are intact in fingers and toes.  COORDINATION: Rapid alternating movements and fine finger movements are intact. There is no dysmetria on finger-to-nose and heel-knee-shin.     GAIT/STANCE: Posture is normal. Gait is steady with normal steps, base, arm swing, and turning. Heel and toe walking are normal. Tandem gait is normal.  Romberg is absent.   DIAGNOSTIC DATA (LABS, IMAGING, TESTING) - I reviewed patient records, labs, notes, testing and imaging myself where available.   ASSESSMENT AND PLAN  Mario Mcbride is a 31 y.o. male   Probable epilepsy  Keep current dose of Keppra 1000 mg twice a day  Repeat EEG  No driving until episode free for 6 months  Get MRI brain CD, and previous EEG report to review   Levert Feinstein, M.D. Ph.D.  [facility] [address], [address] [place], [place] [address] Ph: [phone] Fax: [phone]  CC: Referring Provider

## 2016-12-20 ENCOUNTER — Ambulatory Visit (INDEPENDENT_AMBULATORY_CARE_PROVIDER_SITE_OTHER): Payer: Self-pay | Admitting: Neurology

## 2016-12-20 DIAGNOSIS — G40909 Epilepsy, unspecified, not intractable, without status epilepticus: Secondary | ICD-10-CM

## 2016-12-22 NOTE — Procedures (Signed)
   HISTORY: 31 years old male, with history of seizure,  ECHNIQUE:  16 channel EEG was performed based on standard 10-16 international system. One channel was dedicated to EKG, which has demonstrates normal sinus rhythm of 66 beats per minutes.  Upon awakening, the posterior background activity was mildly dysarrythmic, in the theta range reactive to eye opening and closure. Anterior activity was higher amplitude dysarrhythmic theta range activity  There was no evidence of epileptiform discharge.  Photic stimulation was performed, which induced a symmetric photic driving.  Hyperventilation was performed, there was no abnormality elicit other than continued mild background slowing.  No sleep was achieved.  CONCLUSION: This is a mild abnormal awake EEG.  There is no electrodiagnostic evidence of epileptiform discharge. The common etiology for mild background slowing are metabolic toxic.  Levert FeinsteinYijun Arbie Blankley, M.D. Ph.D.  [facility] [address] [place], [place] [address] Phone: [phone] Fax:      [phone]

## 2017-02-15 ENCOUNTER — Ambulatory Visit: Payer: Self-pay | Admitting: Neurology

## 2017-02-15 ENCOUNTER — Telehealth: Payer: Self-pay | Admitting: *Deleted

## 2017-02-15 NOTE — Telephone Encounter (Signed)
No showed follow up appointment. 

## 2017-02-16 ENCOUNTER — Encounter: Payer: Self-pay | Admitting: Neurology

## 2017-03-26 ENCOUNTER — Encounter (HOSPITAL_BASED_OUTPATIENT_CLINIC_OR_DEPARTMENT_OTHER): Payer: Self-pay | Admitting: *Deleted

## 2017-03-26 ENCOUNTER — Emergency Department (HOSPITAL_BASED_OUTPATIENT_CLINIC_OR_DEPARTMENT_OTHER): Payer: Self-pay

## 2017-03-26 ENCOUNTER — Emergency Department (HOSPITAL_BASED_OUTPATIENT_CLINIC_OR_DEPARTMENT_OTHER)
Admission: EM | Admit: 2017-03-26 | Discharge: 2017-03-26 | Disposition: A | Discharge status: Home / Self Care | Payer: Self-pay | Attending: Emergency Medicine | Admitting: Emergency Medicine

## 2017-03-26 DIAGNOSIS — Y9259 Other trade areas as the place of occurrence of the external cause: Secondary | ICD-10-CM | POA: Insufficient documentation

## 2017-03-26 DIAGNOSIS — Y999 Unspecified external cause status: Secondary | ICD-10-CM | POA: Insufficient documentation

## 2017-03-26 DIAGNOSIS — S0181XA Laceration without foreign body of other part of head, initial encounter: Secondary | ICD-10-CM | POA: Insufficient documentation

## 2017-03-26 DIAGNOSIS — S0990XA Unspecified injury of head, initial encounter: Secondary | ICD-10-CM

## 2017-03-26 DIAGNOSIS — R569 Unspecified convulsions: Secondary | ICD-10-CM | POA: Insufficient documentation

## 2017-03-26 DIAGNOSIS — Z79899 Other long term (current) drug therapy: Secondary | ICD-10-CM | POA: Insufficient documentation

## 2017-03-26 DIAGNOSIS — W010XXA Fall on same level from slipping, tripping and stumbling without subsequent striking against object, initial encounter: Secondary | ICD-10-CM | POA: Insufficient documentation

## 2017-03-26 DIAGNOSIS — Y939 Activity, unspecified: Secondary | ICD-10-CM | POA: Insufficient documentation

## 2017-03-26 DIAGNOSIS — Z87891 Personal history of nicotine dependence: Secondary | ICD-10-CM | POA: Insufficient documentation

## 2017-03-26 LAB — BASIC METABOLIC PANEL
ANION GAP: 6 (ref 5–15)
BUN: 16 mg/dL (ref 6–20)
CO2: 23 mmol/L (ref 22–32)
Calcium: 9.2 mg/dL (ref 8.9–10.3)
Chloride: 107 mmol/L (ref 101–111)
Creatinine, Ser: 0.95 mg/dL (ref 0.61–1.24)
GLUCOSE: 89 mg/dL (ref 65–99)
POTASSIUM: 4.2 mmol/L (ref 3.5–5.1)
SODIUM: 136 mmol/L (ref 135–145)

## 2017-03-26 MED ORDER — LORAZEPAM 2 MG/ML IJ SOLN
1.0000 mg | Freq: Once | INTRAMUSCULAR | Status: AC
Start: 2017-03-26 — End: 2017-03-26
  Administered 2017-03-26: 1 mg via INTRAVENOUS
  Filled 2017-03-26: qty 1

## 2017-03-26 MED ORDER — LIDOCAINE-EPINEPHRINE (PF) 2 %-1:200000 IJ SOLN
20.0000 mL | Freq: Once | INTRAMUSCULAR | Status: AC
  Administered 2017-03-26: 20 mL
  Filled 2017-03-26: qty 20

## 2017-03-26 NOTE — ED Triage Notes (Signed)
Pt arrives to the ED s/p seizure. States that he was outside and the next thing he remembers he was in the kitchen area and started "coming around" states his clothes were wet. Pt alert and oriented times 3. Last seizure 3 weeks ago and medications were adjusted. Pt also has 2 approx 2 inch vertical  lacerations noted to his forehead. Seizure precautions in place

## 2017-03-26 NOTE — ED Provider Notes (Signed)
[facility] EMERGENCY DEPARTMENT Provider Note   CSN: [id number] Arrival date & time: 03/26/17  0451     History   Chief Complaint No chief complaint on file.   HPI Mario Mcbride is a 31 y.o. male.  HPI  This is a 31 year old male with a history of seizure disorder who presents with seizure activity and a head injury.  Patient reports that he was outside of his hotel when he was awoken after having an apparent seizure.  He does not remember much.  He has a history of seizures.  Previously was on Dilantin but currently is on Keppra.  He states that time he has required more than one medication.  He is currently taking Keppra 1 g twice daily.  He reports compliance with medications.  Last seizure was 2 weeks ago.  He did fall hitting his head.  He has not had any vomiting.  He presumes he fell from standing.  Currently he feels at his baseline.  Past Medical History:  Diagnosis Date  . Seizures [facility])     Patient Active Problem List   Diagnosis Date Noted  . Dilantin toxicity, accidental or unintentional, initial encounter 11/27/2016  . Seizure disorder (HCC) 11/27/2016  . Hyperglycemia 11/27/2016  . Elevated BP without diagnosis of hypertension 11/27/2016    Past Surgical History:  Procedure Laterality Date  . FOOT SURGERY Left   . FOOT SURGERY         Home Medications    Prior to Admission medications   Medication Sig Start Date End Date Taking? Authorizing Provider  levETIRAcetam (KEPPRA) 1000 MG tablet Take 1,000 mg by mouth 2 (two) times daily.    [provider]    Family History Family History  Problem Relation Age of Onset  . Diabetes Mother     Social History Social History   Tobacco Use  . Smoking status: Former Smoker    Types: Cigarettes  . Smokeless tobacco: Never Used  Substance Use Topics  . Alcohol use: No    Comment: yrs ago   . Drug use: No    Comment: took LSD once 7 mo ago.     Allergies   Patient has no known  allergies.   Review of Systems Review of Systems  Respiratory: Negative for shortness of breath.   Cardiovascular: Negative for leg swelling.  Gastrointestinal: Negative for abdominal pain, nausea and vomiting.  Skin: Positive for wound.  Neurological: Positive for seizures and headaches. Negative for weakness and numbness.  All other systems reviewed and are negative.    Physical Exam Updated Vital Signs BP 129/79 (BP Location: Right Arm)   Pulse 84   Resp 20   SpO2 100%   Physical Exam  Constitutional: He is oriented to person, place, and time. He appears well-developed and well-nourished.  HENT:  Head: Normocephalic. Head is with laceration.    Mouth/Throat: Oropharynx is clear and moist.  2 vertical lacerations just on the medial aspects of the bilateral eyebrows, both are approximately 4 cm in length  Eyes: Pupils are equal, round, and reactive to light.  Neck: Normal range of motion. Neck supple.  Cardiovascular: Normal rate, regular rhythm and normal heart sounds.  No murmur heard. Pulmonary/Chest: Effort normal and breath sounds normal. No respiratory distress. He has no wheezes.  Abdominal: Soft. Bowel sounds are normal. There is no tenderness. There is no rebound.  Musculoskeletal: He exhibits no edema.  Lymphadenopathy:    He has no cervical adenopathy.  Neurological:  He is alert and oriented to person, place, and time.  Cranial nerves II through XII intact, 5 out of 5 strength in all 4 extremities, no dysmetria to finger-nose-finger  Skin: Skin is warm and dry.  Psychiatric: He has a normal mood and affect.  Nursing note and vitals reviewed.    ED Treatments / Results  Labs (all labs ordered are listed, but only abnormal results are displayed) Labs Reviewed  BASIC METABOLIC PANEL    EKG  EKG Interpretation None       Radiology Ct Head Wo Contrast  Result Date: 03/26/2017 CLINICAL DATA:  Seizure. Posttraumatic headache. Lacerations over the  forehead. EXAM: CT HEAD WITHOUT CONTRAST TECHNIQUE: Contiguous axial images were obtained from the base of the skull through the vertex without intravenous contrast. COMPARISON:  11/27/2016 FINDINGS: Brain: Prominent CSF spaces in the anterior left middle cranial fossa and in the cisterna magna. No change since previous study. No mass-effect or midline shift. Gray-white matter junctions are distinct. Basal cisterns are not effaced. No ventricular dilatation. No acute intracranial hemorrhage. Vascular: No hyperdense vessel or unexpected calcification. Skull: No depressed skull fractures. Sinuses/Orbits: No acute finding. Other: Subcutaneous scalp lacerations over the forehead region. IMPRESSION: No acute intracranial abnormalities. Prominent CSF spaces in the left middle cranial fossa and cisterna magna region, no change since previous study. Electronically Signed   By: Burman NievesWilliam  Stevens M.D.   On: 03/26/2017 05:56    Procedures Procedures (including critical care time)   LACERATION REPAIR Performed by: Shon BatonHORTON, Tiera Mensinger F Authorized by: Shon BatonHORTON, Kaidyn Javid F Consent: Verbal consent obtained. Risks and benefits: risks, benefits and alternatives were discussed Consent given by: patient Patient identity confirmed: provided demographic data Prepped and Draped in normal sterile fashion Wound explored  Laceration Location: right forehead  Laceration Length: 4 cm  No Foreign Bodies seen or palpated  Anesthesia: local infiltration  Local anesthetic: lidocaine 1% 1 epinephrine  Anesthetic total: 2 ml  Irrigation method: syringe Amount of cleaning: standard  Skin closure: 5-0 gut  Number of sutures: 8  Technique: interrupted  Patient tolerance: Patient tolerated the procedure well with no immediate complications.  LACERATION REPAIR Performed by: Shon BatonHORTON, Damiean Lukes F Authorized by: Shon BatonHORTON, Harvis Mabus F Consent: Verbal consent obtained. Risks and benefits: risks, benefits and alternatives were  discussed Consent given by: patient Patient identity confirmed: provided demographic data Prepped and Draped in normal sterile fashion Wound explored  Laceration Location: left forehead  Laceration Length: 4 cm  No Foreign Bodies seen or palpated  Anesthesia: local infiltration  Local anesthetic: lidocaine 1% 1 epinephrine  Anesthetic total: 2 ml  Irrigation method: syringe Amount of cleaning: standard  Skin closure: 5-0 gut  Number of sutures: 7  Technique: interrupted  Patient tolerance: Patient tolerated the procedure well with no immediate complications.  Medications Ordered in ED Medications  lidocaine-EPINEPHrine (XYLOCAINE W/EPI) 2 %-1:200000 (PF) injection 20 mL (20 mLs Infiltration Given by Other 03/26/17 [id number])  LORazepam (ATIVAN) injection 1 mg (1 mg Intravenous Given 03/26/17 0608)     Initial Impression / Assessment and Plan / ED Course  I have reviewed the triage vital signs and the nursing notes.  Pertinent labs & imaging results that were available during my care of the patient were reviewed by me and considered in my medical decision making (see chart for details).     She presents with trauma following seizure activity.  History of seizures.  Currently has baseline.  Neurologically intact.  He does have evidence of lacerations over the forehead likely  sustained from trauma related to seizure.  He is not nauseous but cannot give me any further info regarding his trauma.  For this reason, CT scan obtained.  It is negative for acute bleed.  Basic metabolic panel without evidence of metabolic derangement.  Patient was given 1 mg of Ativan.  He tolerated sutures without complication.  Patient previously saw [facility].  Recommend calling this morning for recommendations regarding adjustments in medications.  Patient stated understanding.  After history, exam, and medical workup I feel the patient has been appropriately medically screened and  is safe for discharge home. Pertinent diagnoses were discussed with the patient. Patient was given return precautions.   Final Clinical Impressions(s) / ED Diagnoses   Final diagnoses:  Seizure (HCC)  Laceration of forehead, initial encounter  Minor head injury, initial encounter    ED Discharge Orders    None       Shon Baton, MD 03/26/17 [phone]

## 2017-03-26 NOTE — Discharge Instructions (Signed)
You were seen today after having seizure activity.  Call your neurologist for adjustment in your seizure medications.  Your CT scan is negative.  You did sustain a laceration to the forehead.  These are absorbable stitches.  Dressed with antibiotic ointment.  Once you scarred, use sunscreen to prevent worsening scar.

## 2017-04-18 ENCOUNTER — Telehealth: Payer: Self-pay | Admitting: Neurology

## 2017-04-18 NOTE — Telephone Encounter (Signed)
New fax number [phone] - medical release faxed and confirmed.

## 2017-04-18 NOTE — Telephone Encounter (Signed)
I was able to get the MRI request faxed to complete.  The EEG request failed to transmit three times.  The patient is going to get an alternate fax and call me back with her new number.

## 2017-04-18 NOTE — Telephone Encounter (Signed)
Per billing department: If the patient will call [phone] and make a payment, we can schedule him.  Patient is aware and will call.   Also, a medical consent form has been sent to [facility] for his MRI disc and EEG reports.  Faxed release to: [phone] for MRI information and [phone] for EEG information.

## 2017-04-18 NOTE — Telephone Encounter (Signed)
Pt called said he has been out of the [place] and does not remember what the provider wanted him to do. Please call to discuss

## 2017-04-25 ENCOUNTER — Telehealth: Payer: Self-pay | Admitting: Neurology

## 2017-04-25 NOTE — Telephone Encounter (Signed)
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

## 2017-06-19 ENCOUNTER — Encounter: Payer: Self-pay | Admitting: Neurology

## 2017-06-19 ENCOUNTER — Ambulatory Visit: Payer: Self-pay | Admitting: Neurology

## 2017-06-19 ENCOUNTER — Encounter (INDEPENDENT_AMBULATORY_CARE_PROVIDER_SITE_OTHER): Payer: Self-pay

## 2017-06-19 VITALS — BP 128/88 | HR 60 | Ht 71.0 in | Wt 196.0 lb

## 2017-06-19 DIAGNOSIS — G40909 Epilepsy, unspecified, not intractable, without status epilepticus: Secondary | ICD-10-CM

## 2017-06-19 MED ORDER — LEVETIRACETAM ER 750 MG PO TB24: 3000.0000 mg | ORAL_TABLET | Freq: Every day | ORAL | 4 refills | Status: AC

## 2017-06-19 MED ORDER — LAMOTRIGINE 25 MG PO TABS: ORAL_TABLET | ORAL | 0 refills | Status: AC

## 2017-06-19 MED ORDER — LAMOTRIGINE ER 200 MG PO TB24: 200.0000 mg | ORAL_TABLET | Freq: Every day | ORAL | 4 refills | Status: AC

## 2017-06-19 NOTE — Progress Notes (Signed)
PATIENT: Mario Mcbride DOB: Nov 08, 1985  Chief Complaint  Patient presents with  . Seizures    He would like to review his MRI and EEG results.  Reports seizure activity on the following dates: 03/26/17, 04/11/17, 04/18/17, 05/31/17 and 06/01/17.  Denies any missed doses of medication.     HISTORICAL  [name] is a 32 years old right-handed male accompanied by his wife, seen in refer by hospital to follow-up on his most recent hospital admission in July 2018. Initial evaluation was on December 04 2016.  I reviewed and summarized hospital admission from July 9 to12 2018  He had a history of seizures since 2014, initial seizure happened in a very stressful situation, he was putting to jail for 3 days, in front of more than 40 people, very stressful, he had train of thought about his financial stress, then had generalized seizure, with tongue biting, woke up in the physician's office, had no recollection of the events, mild post event confusion, he had 3-4 similar episodes within 3 months span, had further evaluation at local hospital, reported normal MRI of the brain, EEG, he was treated with Keppra 1000 mg twice a day, Dilantin 100 mg tablets 3 tablets twice a day since 2014,  Despite being compliant with his medications, which he gets from [place], he still has recurrent spells, about 20 spells over the past few years, is often triggered by family stress,  He also reported normal video EEG normal REM sleep patterns, this was done in 2014 at [place].  He moved to [facility] in December 2016, bought a new hotel, is going through renovation, newly married in May 2018, he complains of excessive stress, since May, he decided to take extra dose of 100 mg Dilantin occasionally while he feel more stressed, to try to avoid a recurrent spells, he took about 30 extra tablets of Dilantin over sixties days   By early July 2018, he noticed a revision, unsteady gait, was admitted to the hospital on November 27 2016, Dilantin level was 55.1, I have personally reviewed CT head without contrast that was normal,  His symptoms gradually improved after stopping Dilantin, he is now only taking Keppra 1000 mg twice a day, no recurrent seizure,   I reviewed laboratory evaluations, Dilantin level November 30 2016 was 35.7, 55.1 on November 27 2016, A1c was 5.0, normal CBC, CMP,  Update June 19, 2017: We reviewed the sleep study record from [place] in November 2011, no evidence of obstructive sleep apnea  MRI of brain July 2011, 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 Repeat EEG in August 2018 showed mild background slowing, dysarrhythmia theta range activity.   Nov 5th 2018, woke up from sleep, feeling off,  He could not find his glasses, was confused, bifronal bruise. Apr 11 2017: woke up in the kitchen, lost glasses, confused. Apr 18 2017: woke up in the middle of night, in the meeting room, confused, he was locked out of his room. May 31 2017: he fell in the lobby Jun 01 2017: he talked   He also had a history of left foot injury requiring multiple surgery in the past, had left volar forearm scar, from donating the left median nerve, flexor muscles to repair left foot.   REVIEW OF SYSTEMS: Full 14 system review of systems performed and notable only for dizziness, seizure, blurry vision, double vision  ALLERGIES: No Known Allergies  HOME MEDICATIONS: Current Outpatient Medications  Medication Sig Dispense Refill  . levETIRAcetam (KEPPRA) 1000 MG tablet Take 1,000 mg by mouth 2 (two) times daily.     No current facility-administered medications for this visit.     PAST MEDICAL HISTORY: Past Medical History:  Diagnosis Date  . Seizures (HCC)     PAST SURGICAL HISTORY: Past Surgical History:  Procedure Laterality Date  . FOOT SURGERY Left   . FOOT SURGERY       FAMILY HISTORY: Family History  Problem Relation Age of Onset  . Diabetes Mother     SOCIAL HISTORY:  Social History   Socioeconomic History  . Marital status: Married    Spouse name: Not on file  . Number of children: Not on file  . Years of education: Not on file  . Highest education level: Not on file  Social Needs  . Financial resource strain: Not on file  . Food insecurity - worry: Not on file  . Food insecurity - inability: Not on file  . Transportation needs - medical: Not on file  . Transportation needs - non-medical: Not on file  Occupational History  . Not on file  Tobacco Use  . Smoking status: Former Smoker    Types: Cigarettes  . Smokeless tobacco: Never Used  Substance and Sexual Activity  . Alcohol use: No    Comment: yrs ago   . Drug use: No    Comment: took LSD once 7 mo ago.  Marland Kitchen. Sexual activity: Not on file  Other Topics Concern  . Not on file  Social History Narrative   Lives at home with spouse.  Works on Engineer, mininghotel renovation.  No children.  Some College.      PHYSICAL EXAM   Vitals:   06/19/17 0822  BP: 128/88  Pulse: 60  Weight: 196 lb (88.9 kg)  Height: 5\' 11"  (1.803 m)    Not recorded      Body mass index is 27.34 kg/m.  PHYSICAL EXAMNIATION:  Gen: NAD, conversant, well nourised, obese, well groomed                     Cardiovascular: Regular rate rhythm, no peripheral edema, warm, nontender. Eyes: Conjunctivae clear without exudates or hemorrhage Neck: Supple, no carotid bruits. Pulmonary: Clear to auscultation bilaterally   NEUROLOGICAL EXAM:  MENTAL STATUS: Speech:    Speech is normal; fluent and spontaneous with normal comprehension.  Cognition:     Orientation to time, place and person     Normal recent and remote memory     Normal Attention span and concentration     Normal Language, naming, repeating,spontaneous speech     Fund of knowledge   CRANIAL NERVES: CN II: Visual fields are full to confrontation.  Fundoscopic exam is normal with sharp discs and no vascular changes. Pupils are round equal and briskly reactive to light. CN III, IV, VI: extraocular movement are normal. No ptosis. CN V: Facial sensation is intact to pinprick in all 3 divisions bilaterally. Corneal responses are intact.  CN VII: Face is symmetric with normal eye closure and smile. CN VIII: Hearing is normal to rubbing fingers CN IX, X: Palate elevates symmetrically. Phonation is normal. CN XI: Head turning and shoulder shrug are intact CN XII: Tongue is midline with normal movements and no atrophy.  MOTOR:  he has a big scar on the left ulnar surface of forearm, mild left ankle dorsiflexion weakness  REFLEXES: Reflexes are 2+ and symmetric at the biceps, triceps, knees, and ankles. Plantar responses are flexor.  SENSORY: Intact to light touch, pinprick, positional sensation and vibratory sensation are intact in fingers and toes.  COORDINATION: Rapid alternating movements and fine finger movements are intact. There is no dysmetria on finger-to-nose and heel-knee-shin.    GAIT/STANCE: Mildly limp, drag left leg across the floor   DIAGNOSTIC DATA (LABS, IMAGING, TESTING) - I reviewed patient records, labs, notes, testing and imaging myself where available.   ASSESSMENT AND PLAN  [place]us Ice is a 32 y.o. male   Complex partial seizure epilepsy  Still has frequent recurrence  Increased Keppra xr 750 mg 4 tablets every night   add on lamotrigine, titrating to lamotrigine xr 200mg  qhs.  Document all events   Levert Feinstein, M.D. Ph.D.  [facility] [address] [place], [place] [address] Ph: [phone] Fax: [phone]  CC: Referring Provider

## 2017-07-19 ENCOUNTER — Telehealth: Payer: Self-pay | Admitting: Neurology

## 2017-07-19 NOTE — Telephone Encounter (Signed)
Pt called just wanting clarification on his 3 medications and dosing Lamictal XR, Lamictal and Keppra XR. Please call to discuss

## 2017-07-19 NOTE — Telephone Encounter (Signed)
He wanted to confirm his medication dosing schedule.  We have reviewed it together (from recent office note) and he did not have any further questions.

## 2017-12-17 ENCOUNTER — Ambulatory Visit: Payer: Self-pay | Admitting: Neurology

## 2017-12-17 ENCOUNTER — Telehealth: Payer: Self-pay | Admitting: *Deleted

## 2017-12-17 NOTE — Telephone Encounter (Signed)
No showed follow up appointment. 

## 2017-12-18 ENCOUNTER — Encounter: Payer: Self-pay | Admitting: Neurology

## 2019-01-29 IMAGING — CT CT HEAD W/O CM
3 series · 15 of 47 positions shown, 18 images · non-contrast
Comparison: 11/27/2016

CLINICAL DATA: Seizure. Posttraumatic headache. Lacerations over
the forehead.

EXAM:
CT HEAD WITHOUT CONTRAST
TECHNIQUE: Contiguous axial images were obtained from the base of the skull
through the vertex without intravenous contrast.

[Series 2: head wo · axial · 0.44mm/px · z∈[+1082,+1216]mm · 9 of 33 slices shown, 12 images]
[im 3/33  brain]
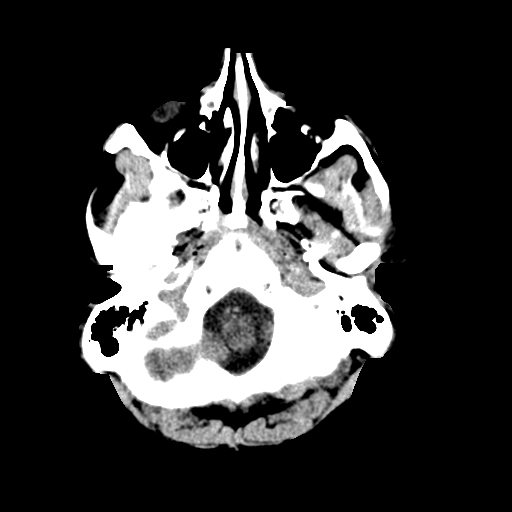
[im 3/33  bone]
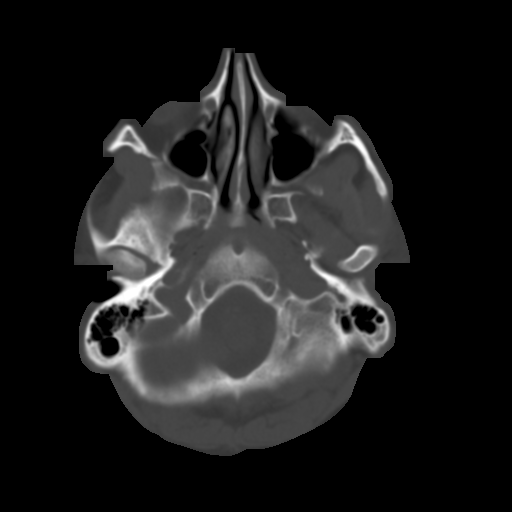
[im 6/33  brain]
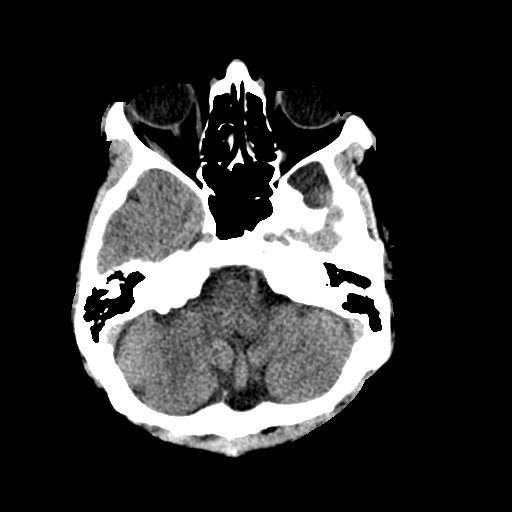
[im 9/33  brain]
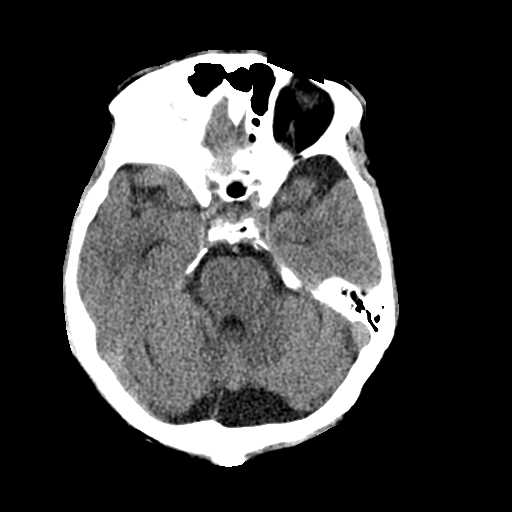
[im 13/33  brain]
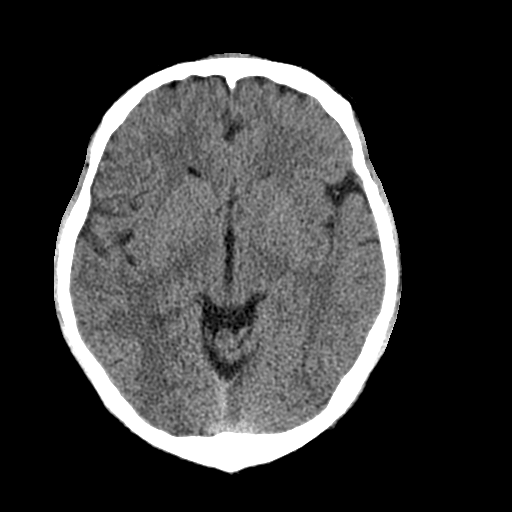
[im 17/33  brain]
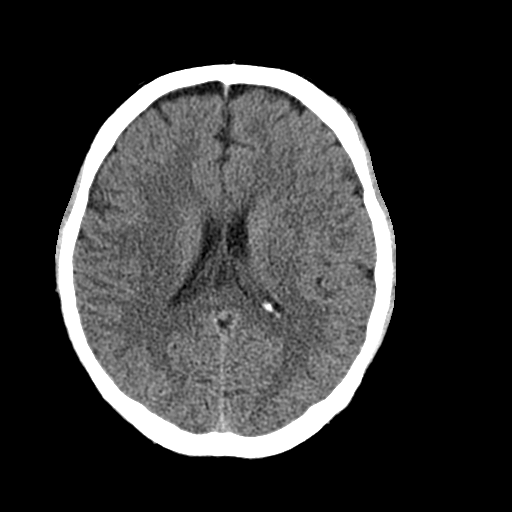
[im 17/33  bone]
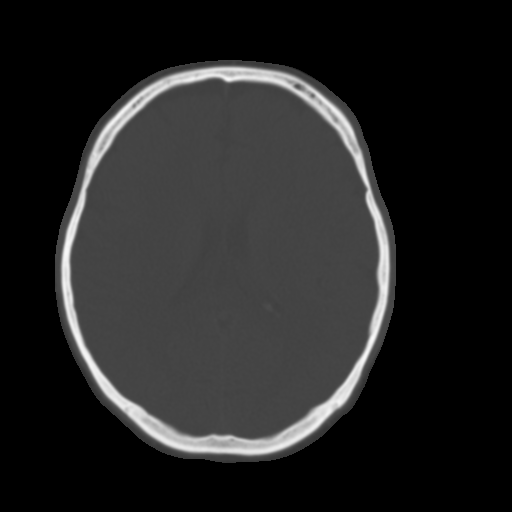
[im 20/33  brain]
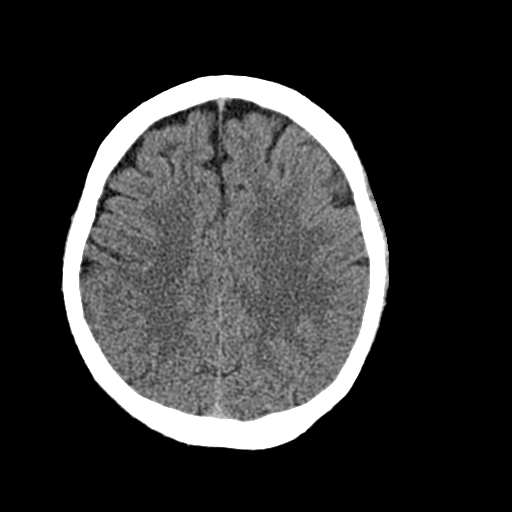
[im 24/33  brain]
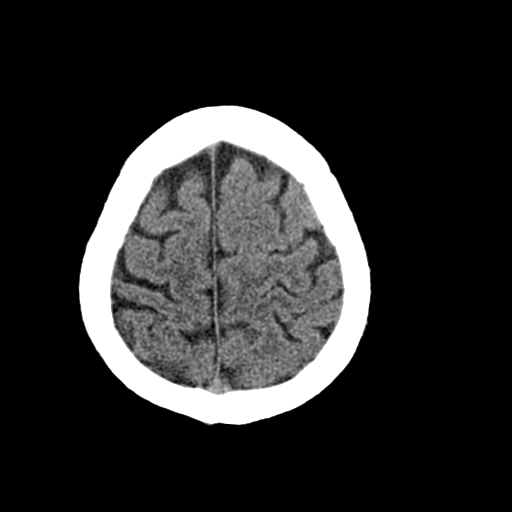
[im 27/33  brain]
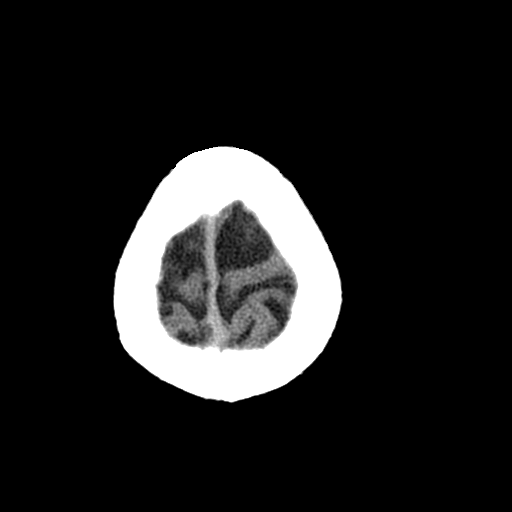
[im 30/33  brain]
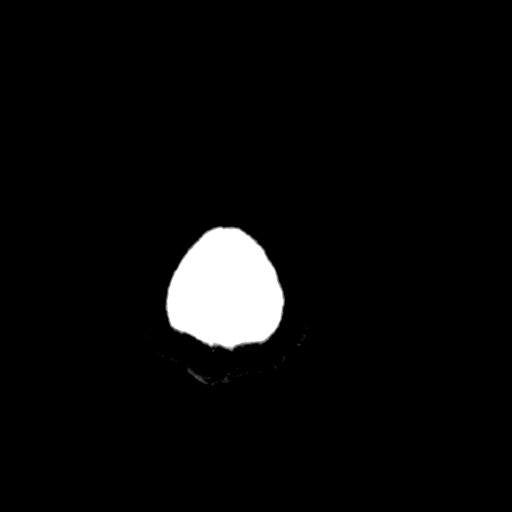
[im 30/33  bone]
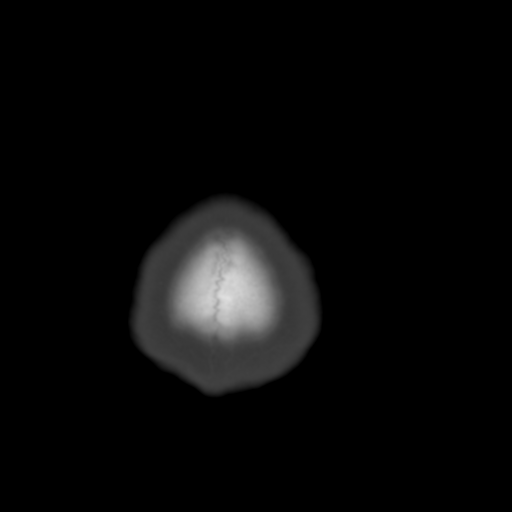

[Series 4: cor soft · coronal · 0.30mm/px · 3 of 73 slices shown]
[im 25/73  brain]
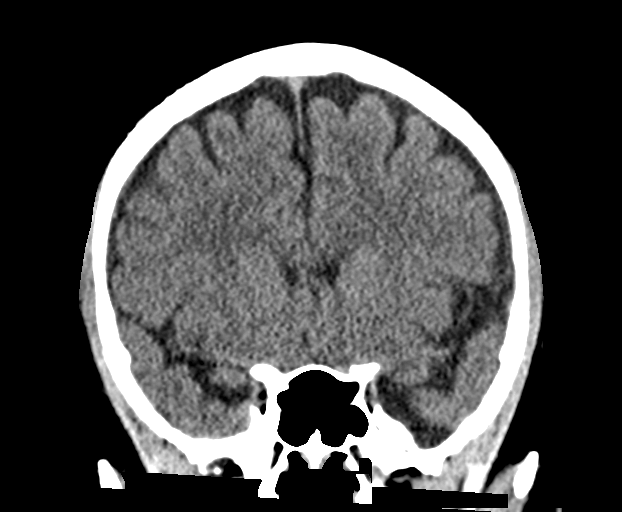
[im 33/73  brain]
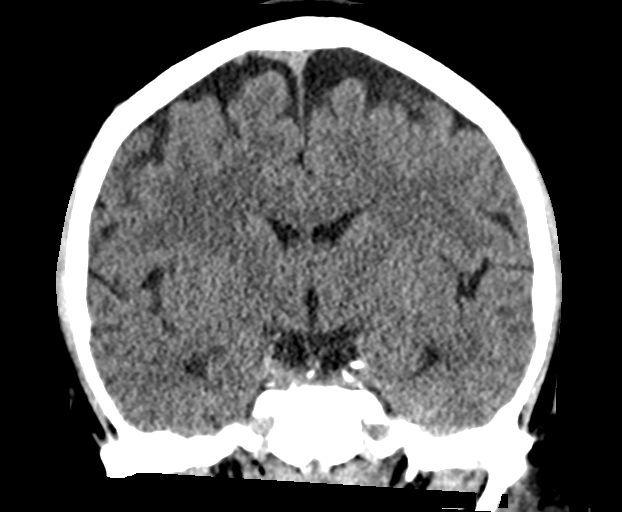
[im 41/73  brain]
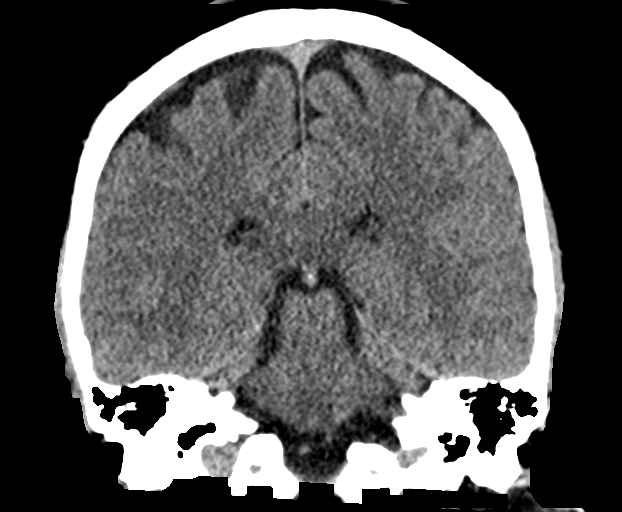

[Series 5: sag soft · sagittal · 0.32mm/px · 3 of 68 slices shown]
[im 23/68  brain]
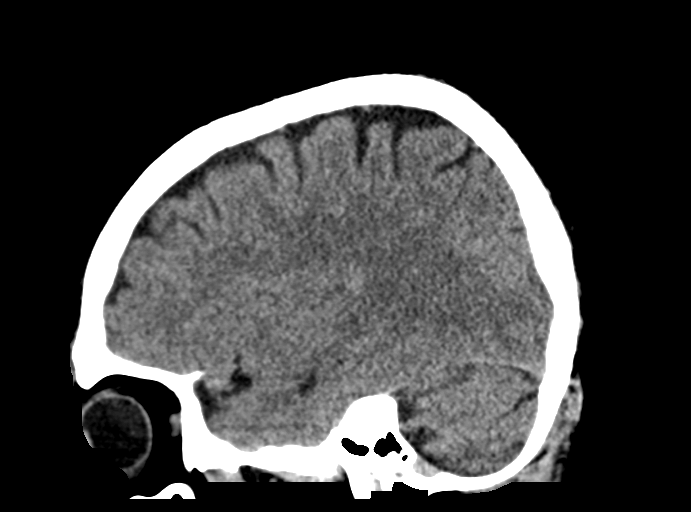
[im 34/68  brain]
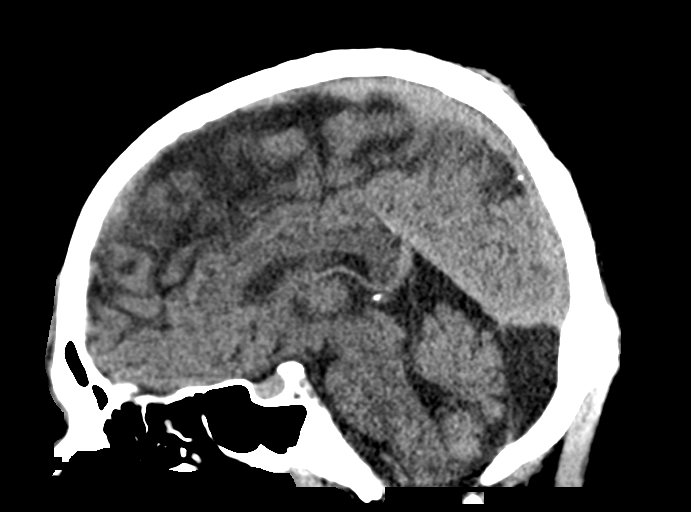
[im 45/68  brain]
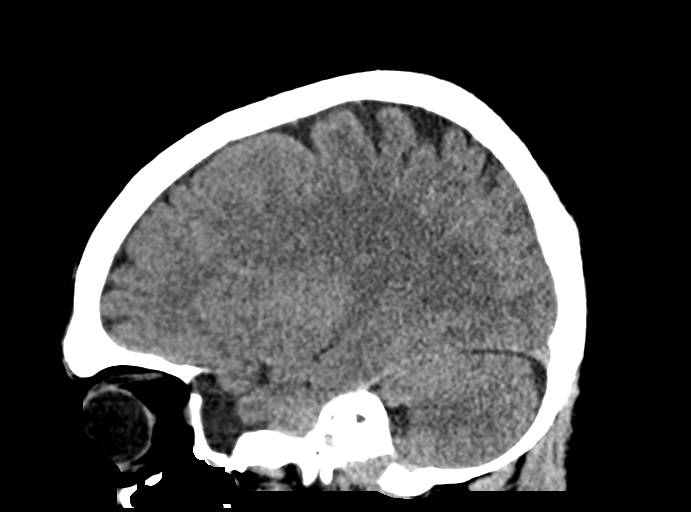

[15 of 47 positions shown; findings below may reference images not displayed]

FINDINGS: Brain: Prominent CSF spaces in the anterior left middle cranial
fossa and in the cisterna magna. No change since previous study. No
mass-effect or midline shift. Gray-white matter junctions are
distinct. Basal cisterns are not effaced. No ventricular dilatation.
No acute intracranial hemorrhage.

Vascular: No hyperdense vessel or unexpected calcification.

Skull: No depressed skull fractures.

Sinuses/Orbits: No acute finding.

Other: Subcutaneous scalp lacerations over the forehead region.
IMPRESSION: No acute intracranial abnormalities. Prominent CSF spaces in the
left middle cranial fossa and cisterna magna region, no change since
previous study.
